# Patient Record
Sex: Male | Born: 1940 | Race: White | Hispanic: No | Marital: Married | Smoking: Never smoker
Health system: Southern US, Community
[De-identification: ages and names within clinical notes are randomized; demographics above are authoritative.]

## PROBLEM LIST (undated history)

## (undated) DIAGNOSIS — I10 Essential (primary) hypertension: Secondary | ICD-10-CM

## (undated) DIAGNOSIS — I700 Atherosclerosis of aorta: Secondary | ICD-10-CM

## (undated) DIAGNOSIS — N400 Enlarged prostate without lower urinary tract symptoms: Secondary | ICD-10-CM

## (undated) DIAGNOSIS — M109 Gout, unspecified: Secondary | ICD-10-CM

## (undated) DIAGNOSIS — E7800 Pure hypercholesterolemia, unspecified: Secondary | ICD-10-CM

## (undated) DIAGNOSIS — M1990 Unspecified osteoarthritis, unspecified site: Secondary | ICD-10-CM

## (undated) DIAGNOSIS — Z860100 Personal history of colon polyps, unspecified: Secondary | ICD-10-CM

## (undated) DIAGNOSIS — I129 Hypertensive chronic kidney disease with stage 1 through stage 4 chronic kidney disease, or unspecified chronic kidney disease: Secondary | ICD-10-CM

## (undated) DIAGNOSIS — N2889 Other specified disorders of kidney and ureter: Secondary | ICD-10-CM

## (undated) DIAGNOSIS — H9190 Unspecified hearing loss, unspecified ear: Secondary | ICD-10-CM

## (undated) DIAGNOSIS — R609 Edema, unspecified: Secondary | ICD-10-CM

## (undated) DIAGNOSIS — N281 Cyst of kidney, acquired: Secondary | ICD-10-CM

## (undated) DIAGNOSIS — C61 Malignant neoplasm of prostate: Secondary | ICD-10-CM

## (undated) DIAGNOSIS — E119 Type 2 diabetes mellitus without complications: Secondary | ICD-10-CM

## (undated) DIAGNOSIS — N1832 Chronic kidney disease, stage 3b: Secondary | ICD-10-CM

## (undated) DIAGNOSIS — K8020 Calculus of gallbladder without cholecystitis without obstruction: Secondary | ICD-10-CM

## (undated) HISTORY — DX: Personal history of colon polyps, unspecified: Z86.0100

## (undated) HISTORY — DX: Calculus of gallbladder without cholecystitis without obstruction: K80.20

## (undated) HISTORY — DX: Chronic kidney disease, stage 3b: N18.32

## (undated) HISTORY — DX: Edema, unspecified: R60.9

## (undated) HISTORY — DX: Other specified disorders of kidney and ureter: N28.89

## (undated) HISTORY — DX: Cyst of kidney, acquired: N28.1

## (undated) HISTORY — PX: UMBILICAL HERNIA REPAIR: SHX196

## (undated) HISTORY — DX: Hypertensive chronic kidney disease with stage 1 through stage 4 chronic kidney disease, or unspecified chronic kidney disease: I12.9

## (undated) HISTORY — DX: Gout, unspecified: M10.9

## (undated) HISTORY — DX: Unspecified hearing loss, unspecified ear: H91.90

## (undated) HISTORY — DX: Atherosclerosis of aorta: I70.0

## (undated) HISTORY — PX: TONSILLECTOMY: SUR1361

## (undated) HISTORY — DX: Pure hypercholesterolemia, unspecified: E78.00

## (undated) HISTORY — DX: Benign prostatic hyperplasia without lower urinary tract symptoms: N40.0

## (undated) HISTORY — PX: PROSTATE BIOPSY: SHX241

---

## 1988-06-15 HISTORY — PX: EYE SURGERY: SHX253

## 2016-06-30 DIAGNOSIS — Z0000 Encounter for general adult medical examination without abnormal findings: Secondary | ICD-10-CM | POA: Diagnosis not present

## 2016-06-30 DIAGNOSIS — I10 Essential (primary) hypertension: Secondary | ICD-10-CM | POA: Diagnosis not present

## 2016-06-30 DIAGNOSIS — E119 Type 2 diabetes mellitus without complications: Secondary | ICD-10-CM | POA: Diagnosis not present

## 2016-06-30 DIAGNOSIS — Z6824 Body mass index (BMI) 24.0-24.9, adult: Secondary | ICD-10-CM | POA: Diagnosis not present

## 2016-06-30 DIAGNOSIS — E782 Mixed hyperlipidemia: Secondary | ICD-10-CM | POA: Diagnosis not present

## 2016-06-30 DIAGNOSIS — N189 Chronic kidney disease, unspecified: Secondary | ICD-10-CM | POA: Diagnosis not present

## 2016-09-07 DIAGNOSIS — N401 Enlarged prostate with lower urinary tract symptoms: Secondary | ICD-10-CM | POA: Diagnosis not present

## 2016-09-07 DIAGNOSIS — R972 Elevated prostate specific antigen [PSA]: Secondary | ICD-10-CM | POA: Diagnosis not present

## 2016-09-07 DIAGNOSIS — R310 Gross hematuria: Secondary | ICD-10-CM | POA: Diagnosis not present

## 2016-12-28 DIAGNOSIS — E782 Mixed hyperlipidemia: Secondary | ICD-10-CM | POA: Diagnosis not present

## 2016-12-28 DIAGNOSIS — E119 Type 2 diabetes mellitus without complications: Secondary | ICD-10-CM | POA: Diagnosis not present

## 2016-12-28 DIAGNOSIS — I10 Essential (primary) hypertension: Secondary | ICD-10-CM | POA: Diagnosis not present

## 2016-12-28 DIAGNOSIS — Z6824 Body mass index (BMI) 24.0-24.9, adult: Secondary | ICD-10-CM | POA: Diagnosis not present

## 2017-03-10 DIAGNOSIS — R310 Gross hematuria: Secondary | ICD-10-CM | POA: Diagnosis not present

## 2017-03-10 DIAGNOSIS — R972 Elevated prostate specific antigen [PSA]: Secondary | ICD-10-CM | POA: Diagnosis not present

## 2017-03-10 DIAGNOSIS — N401 Enlarged prostate with lower urinary tract symptoms: Secondary | ICD-10-CM | POA: Diagnosis not present

## 2017-03-14 DIAGNOSIS — Z23 Encounter for immunization: Secondary | ICD-10-CM | POA: Diagnosis not present

## 2017-04-07 DIAGNOSIS — E119 Type 2 diabetes mellitus without complications: Secondary | ICD-10-CM | POA: Diagnosis not present

## 2017-04-07 DIAGNOSIS — Z961 Presence of intraocular lens: Secondary | ICD-10-CM | POA: Diagnosis not present

## 2017-04-07 DIAGNOSIS — H35362 Drusen (degenerative) of macula, left eye: Secondary | ICD-10-CM | POA: Diagnosis not present

## 2017-04-07 DIAGNOSIS — H0019 Chalazion unspecified eye, unspecified eyelid: Secondary | ICD-10-CM | POA: Diagnosis not present

## 2017-07-01 DIAGNOSIS — E559 Vitamin D deficiency, unspecified: Secondary | ICD-10-CM | POA: Diagnosis not present

## 2017-07-01 DIAGNOSIS — E782 Mixed hyperlipidemia: Secondary | ICD-10-CM | POA: Diagnosis not present

## 2017-07-01 DIAGNOSIS — Z6824 Body mass index (BMI) 24.0-24.9, adult: Secondary | ICD-10-CM | POA: Diagnosis not present

## 2017-07-01 DIAGNOSIS — I10 Essential (primary) hypertension: Secondary | ICD-10-CM | POA: Diagnosis not present

## 2017-07-01 DIAGNOSIS — E119 Type 2 diabetes mellitus without complications: Secondary | ICD-10-CM | POA: Diagnosis not present

## 2017-09-22 DIAGNOSIS — N401 Enlarged prostate with lower urinary tract symptoms: Secondary | ICD-10-CM | POA: Diagnosis not present

## 2017-09-22 DIAGNOSIS — R310 Gross hematuria: Secondary | ICD-10-CM | POA: Diagnosis not present

## 2017-09-22 DIAGNOSIS — R972 Elevated prostate specific antigen [PSA]: Secondary | ICD-10-CM | POA: Diagnosis not present

## 2017-10-28 DIAGNOSIS — L570 Actinic keratosis: Secondary | ICD-10-CM | POA: Diagnosis not present

## 2017-10-28 DIAGNOSIS — L918 Other hypertrophic disorders of the skin: Secondary | ICD-10-CM | POA: Diagnosis not present

## 2017-11-30 DIAGNOSIS — M25552 Pain in left hip: Secondary | ICD-10-CM | POA: Diagnosis not present

## 2017-11-30 DIAGNOSIS — Z79899 Other long term (current) drug therapy: Secondary | ICD-10-CM | POA: Diagnosis not present

## 2017-11-30 DIAGNOSIS — D126 Benign neoplasm of colon, unspecified: Secondary | ICD-10-CM | POA: Diagnosis not present

## 2017-11-30 DIAGNOSIS — N183 Chronic kidney disease, stage 3 (moderate): Secondary | ICD-10-CM | POA: Diagnosis not present

## 2017-11-30 DIAGNOSIS — E7800 Pure hypercholesterolemia, unspecified: Secondary | ICD-10-CM | POA: Diagnosis not present

## 2017-11-30 DIAGNOSIS — E1122 Type 2 diabetes mellitus with diabetic chronic kidney disease: Secondary | ICD-10-CM | POA: Diagnosis not present

## 2017-11-30 DIAGNOSIS — M25551 Pain in right hip: Secondary | ICD-10-CM | POA: Diagnosis not present

## 2018-02-23 DIAGNOSIS — Z86010 Personal history of colonic polyps: Secondary | ICD-10-CM | POA: Diagnosis not present

## 2018-02-23 DIAGNOSIS — D122 Benign neoplasm of ascending colon: Secondary | ICD-10-CM | POA: Diagnosis not present

## 2018-02-23 DIAGNOSIS — K5730 Diverticulosis of large intestine without perforation or abscess without bleeding: Secondary | ICD-10-CM | POA: Diagnosis not present

## 2018-02-23 DIAGNOSIS — D120 Benign neoplasm of cecum: Secondary | ICD-10-CM | POA: Diagnosis not present

## 2018-02-25 DIAGNOSIS — D122 Benign neoplasm of ascending colon: Secondary | ICD-10-CM | POA: Diagnosis not present

## 2018-02-25 DIAGNOSIS — D120 Benign neoplasm of cecum: Secondary | ICD-10-CM | POA: Diagnosis not present

## 2018-04-10 DIAGNOSIS — Z23 Encounter for immunization: Secondary | ICD-10-CM | POA: Diagnosis not present

## 2018-05-09 DIAGNOSIS — E1122 Type 2 diabetes mellitus with diabetic chronic kidney disease: Secondary | ICD-10-CM | POA: Diagnosis not present

## 2018-05-09 DIAGNOSIS — Z79899 Other long term (current) drug therapy: Secondary | ICD-10-CM | POA: Diagnosis not present

## 2018-05-09 DIAGNOSIS — Z0000 Encounter for general adult medical examination without abnormal findings: Secondary | ICD-10-CM | POA: Diagnosis not present

## 2018-05-09 DIAGNOSIS — E119 Type 2 diabetes mellitus without complications: Secondary | ICD-10-CM | POA: Diagnosis not present

## 2018-05-09 DIAGNOSIS — N183 Chronic kidney disease, stage 3 (moderate): Secondary | ICD-10-CM | POA: Diagnosis not present

## 2018-05-09 DIAGNOSIS — Z1389 Encounter for screening for other disorder: Secondary | ICD-10-CM | POA: Diagnosis not present

## 2018-05-09 DIAGNOSIS — Z135 Encounter for screening for eye and ear disorders: Secondary | ICD-10-CM | POA: Diagnosis not present

## 2018-05-09 DIAGNOSIS — Z23 Encounter for immunization: Secondary | ICD-10-CM | POA: Diagnosis not present

## 2018-05-09 DIAGNOSIS — E7800 Pure hypercholesterolemia, unspecified: Secondary | ICD-10-CM | POA: Diagnosis not present

## 2018-06-21 DIAGNOSIS — H524 Presbyopia: Secondary | ICD-10-CM | POA: Diagnosis not present

## 2018-06-21 DIAGNOSIS — E119 Type 2 diabetes mellitus without complications: Secondary | ICD-10-CM | POA: Diagnosis not present

## 2018-06-21 DIAGNOSIS — H353121 Nonexudative age-related macular degeneration, left eye, early dry stage: Secondary | ICD-10-CM | POA: Diagnosis not present

## 2018-07-01 DIAGNOSIS — E1122 Type 2 diabetes mellitus with diabetic chronic kidney disease: Secondary | ICD-10-CM | POA: Diagnosis not present

## 2018-07-01 DIAGNOSIS — M5442 Lumbago with sciatica, left side: Secondary | ICD-10-CM | POA: Diagnosis not present

## 2018-07-01 DIAGNOSIS — N183 Chronic kidney disease, stage 3 (moderate): Secondary | ICD-10-CM | POA: Diagnosis not present

## 2018-07-01 DIAGNOSIS — I129 Hypertensive chronic kidney disease with stage 1 through stage 4 chronic kidney disease, or unspecified chronic kidney disease: Secondary | ICD-10-CM | POA: Diagnosis not present

## 2018-11-09 DIAGNOSIS — N183 Chronic kidney disease, stage 3 (moderate): Secondary | ICD-10-CM | POA: Diagnosis not present

## 2018-11-09 DIAGNOSIS — R972 Elevated prostate specific antigen [PSA]: Secondary | ICD-10-CM | POA: Diagnosis not present

## 2018-11-09 DIAGNOSIS — I129 Hypertensive chronic kidney disease with stage 1 through stage 4 chronic kidney disease, or unspecified chronic kidney disease: Secondary | ICD-10-CM | POA: Diagnosis not present

## 2018-11-09 DIAGNOSIS — E1122 Type 2 diabetes mellitus with diabetic chronic kidney disease: Secondary | ICD-10-CM | POA: Diagnosis not present

## 2018-11-09 DIAGNOSIS — Z7984 Long term (current) use of oral hypoglycemic drugs: Secondary | ICD-10-CM | POA: Diagnosis not present

## 2018-12-14 DIAGNOSIS — R972 Elevated prostate specific antigen [PSA]: Secondary | ICD-10-CM | POA: Diagnosis not present

## 2018-12-14 DIAGNOSIS — N401 Enlarged prostate with lower urinary tract symptoms: Secondary | ICD-10-CM | POA: Diagnosis not present

## 2018-12-14 DIAGNOSIS — R351 Nocturia: Secondary | ICD-10-CM | POA: Diagnosis not present

## 2019-03-25 DIAGNOSIS — Z23 Encounter for immunization: Secondary | ICD-10-CM | POA: Diagnosis not present

## 2019-04-28 DIAGNOSIS — R972 Elevated prostate specific antigen [PSA]: Secondary | ICD-10-CM | POA: Diagnosis not present

## 2019-05-08 ENCOUNTER — Other Ambulatory Visit (HOSPITAL_COMMUNITY): Payer: Self-pay | Admitting: Urology

## 2019-05-08 ENCOUNTER — Other Ambulatory Visit: Payer: Self-pay | Admitting: Urology

## 2019-05-08 DIAGNOSIS — C61 Malignant neoplasm of prostate: Secondary | ICD-10-CM

## 2019-05-31 ENCOUNTER — Encounter (HOSPITAL_COMMUNITY)
Admission: RE | Admit: 2019-05-31 | Discharge: 2019-05-31 | Disposition: A | Discharge status: Home / Self Care | Payer: Medicare Other | Source: Ambulatory Visit | Attending: Urology | Admitting: Urology

## 2019-05-31 ENCOUNTER — Ambulatory Visit (HOSPITAL_COMMUNITY)
Admission: RE | Admit: 2019-05-31 | Discharge: 2019-05-31 | Disposition: A | Discharge status: Home / Self Care | Payer: Medicare Other | Source: Ambulatory Visit | Attending: Urology | Admitting: Urology

## 2019-05-31 ENCOUNTER — Other Ambulatory Visit: Payer: Self-pay

## 2019-05-31 DIAGNOSIS — C61 Malignant neoplasm of prostate: Secondary | ICD-10-CM | POA: Diagnosis present

## 2019-05-31 MED ORDER — TECHNETIUM TC 99M MEDRONATE IV KIT
20.9000 | PACK | Freq: Once | INTRAVENOUS | Status: AC
  Administered 2019-05-31: 20.9 via INTRAVENOUS

## 2019-06-23 DIAGNOSIS — H52203 Unspecified astigmatism, bilateral: Secondary | ICD-10-CM | POA: Diagnosis not present

## 2019-06-23 DIAGNOSIS — E119 Type 2 diabetes mellitus without complications: Secondary | ICD-10-CM | POA: Diagnosis not present

## 2019-06-26 ENCOUNTER — Telehealth: Payer: Self-pay | Admitting: Medical Oncology

## 2019-06-26 ENCOUNTER — Encounter: Payer: Self-pay | Admitting: Radiation Oncology

## 2019-06-26 NOTE — Telephone Encounter (Signed)
Left message for Dr. Diona Fanti per Ailene Ards, [place] asking if R renal mass needs to be addressed  before proceeding with prostate cancer treatment? I asked for a return call to discuss.

## 2019-06-26 NOTE — Progress Notes (Signed)
GU Location of Tumor / Histology: prostatic adenocarcinoma  If Prostate Cancer, Gleason Score is (4 + 3) and PSA is (4.61). Prostate volume: 49.82   Rosebud Poles with a history of BPH and hematuria which resolve on Finasteride. Initially, PSA dropped on Finasteride but started rising thus [place] referred to [place] for prostate biopsy.  Biopsies of prostate (if applicable) revealed:   Past/Anticipated interventions by urology, if any: prostate biopsy, CT scan (negative for mets), bone scan (negative), referral to Dr. Tammi Klippel for consideration of radiotherapy  Past/Anticipated interventions by medical oncology, if any: no  Weight changes, if any: no  Bowel/Bladder complaints, if any: IPSS 12. SHIM 24. ("Tell Dr. Tammi Klippel not to do anything to me that would change my sexual function.")Reports weak stream, intermittency, hesitancy, and leakage. Reports nocturia x 1-2.  Taking Flomax. Denies any bowel complaints.  Nausea/Vomiting, if any: no  Pain issues, if any:  Denies   SAFETY ISSUES:  Prior radiation? denies  Pacemaker/ICD? denies  Possible current pregnancy? no, male patient  Is the patient on methotrexate? No  Current Complaints / other details:  79 year old male. Married. Worked in [employer]. Resides in [place], [place]. Father with history of prostate and bladder ca.

## 2019-06-27 ENCOUNTER — Other Ambulatory Visit: Payer: Self-pay

## 2019-06-27 ENCOUNTER — Encounter: Payer: Self-pay | Admitting: Radiation Oncology

## 2019-06-27 ENCOUNTER — Ambulatory Visit
Admission: RE | Admit: 2019-06-27 | Discharge: 2019-06-27 | Disposition: A | Discharge status: Home / Self Care | Payer: Medicare Other | Source: Ambulatory Visit | Attending: Radiation Oncology | Admitting: Radiation Oncology

## 2019-06-27 VITALS — Ht 72.0 in | Wt 171.2 lb

## 2019-06-27 DIAGNOSIS — N400 Enlarged prostate without lower urinary tract symptoms: Secondary | ICD-10-CM | POA: Diagnosis not present

## 2019-06-27 DIAGNOSIS — C61 Malignant neoplasm of prostate: Secondary | ICD-10-CM | POA: Diagnosis not present

## 2019-06-27 DIAGNOSIS — Z79899 Other long term (current) drug therapy: Secondary | ICD-10-CM | POA: Diagnosis not present

## 2019-06-27 DIAGNOSIS — N2889 Other specified disorders of kidney and ureter: Secondary | ICD-10-CM | POA: Diagnosis not present

## 2019-06-27 DIAGNOSIS — Z8042 Family history of malignant neoplasm of prostate: Secondary | ICD-10-CM | POA: Diagnosis not present

## 2019-06-27 DIAGNOSIS — R972 Elevated prostate specific antigen [PSA]: Secondary | ICD-10-CM | POA: Diagnosis not present

## 2019-06-27 HISTORY — DX: Type 2 diabetes mellitus without complications: E11.9

## 2019-06-27 HISTORY — DX: Essential (primary) hypertension: I10

## 2019-06-27 HISTORY — DX: Malignant neoplasm of prostate: C61

## 2019-06-27 NOTE — Progress Notes (Signed)
Radiation Oncology         [phone] ________________________________  Initial outpatient Consultation - Conducted via Telephone due to current COVID-19 concerns for limiting Terry [name] exposure  Name: Terry [name] MRN: [id number]  Date: 06/27/2019  DOB: 11-29-1940  CC:No primary care provider on file.  Terry [name], [name]   REFERRING PHYSICIAN: Franchot [name], [name]  DIAGNOSIS: 79 y.o. gentleman with Stage T1c adenocarcinoma of the prostate with Gleason score of 4+3, and PSA of 4.61 on finasteride.    ICD-10-CM   1. Malignant neoplasm of prostate ([name]     HISTORY OF PRESENT ILLNESS: Terry [name] is a 79 y.o. male with a diagnosis of prostate cancer. He has a longstanding history of BPH with BSO previously followed with Terry urologist while living in [place] and treated with finasteride.  He and Terry [name] relocated to [place] around 2018 and he has continued taking finasteride daily under the care and direction of Terry PCP, Terry [name]. He was noted to have an elevated PSA of 4.78 in May 2020, which was increased from 3.34 in September 2018 and 3.26 in March 2018 despite taking finasteride.  Accordingly, he was referred for evaluation in urology by Dr. Diona [name] on 12/14/2018,  digital rectal examination was performed at that time revealing a larger right lobe without nodularity or other concerning features.  He was started on Flomax at that time due to some increasing LUTS.  A repeat PSA performed on 03/16/2019 remained elevated at 4.61.  Therefore, the Terry [name] proceeded to transrectal ultrasound with 12 biopsies of the prostate on 04/28/2019.  The prostate volume measured 49.82 cc.  Out of 12 core biopsies, 1 was positive.  The maximum Gleason score was 4+3, and this was seen in the left apex lateral.  He underwent staging scans on 05/31/2019 with a CT A/P which revealed a 4 cm exophytic cystic mass in the medial interpolar right kidney with ill-defined/irregular peripheral  enhancement and a 13 mm enhancing nodular component, suspicious for cystic renal cell carcinoma.  There was no evidence for metastatic disease, specifically no lymphadenopathy or suspicious bony lesions. Bone scan that same day was also negative for skeletal metastasis.  The Terry [name] reviewed the biopsy results with Terry urologist and he has kindly been referred today for discussion of potential radiation treatment options.   PREVIOUS RADIATION THERAPY: No  PAST MEDICAL HISTORY:  Past Medical History:  Diagnosis Date   Diabetes mellitus without complication ([facility])    Hypertension    Prostate cancer ([facility])       PAST SURGICAL HISTORY: Past Surgical History:  Procedure Laterality Date   PROSTATE BIOPSY      FAMILY HISTORY:  Family History  Problem Relation Age of Onset   Prostate cancer Father    Bladder Cancer Father    Breast cancer Neg Hx    Colon cancer Neg Hx    Pancreatic cancer Neg Hx     SOCIAL HISTORY:  Social History   Socioeconomic History   Marital status: Married    Spouse name: Terry [name]   Number of children: Not on file   Years of education: Not on file   Highest education level: Not on file  Occupational History    Comment: retired  Tobacco Use   Smoking status: Never Smoker   Smokeless tobacco: Never Used  Substance and Sexual Activity   Alcohol use: Never   Drug use: Never   Sexual activity: Yes  Other Topics Concern   Not on  file  Social History Narrative   2 stepchildren and 2 daughters.   Social Determinants of Health   Financial Resource Strain:    Difficulty of Paying Living Expenses: Not on file  Food Insecurity:    Worried About Charity fundraiser in the Last Year: Not on file   [employer] of Food in the Last Year: Not on file  Transportation Needs:    Lack of Transportation (Medical): Not on file   Lack of Transportation (Non-Medical): Not on file  Physical Activity:    Days of Exercise per Week: Not on  file   Minutes of Exercise per Session: Not on file  Stress:    Feeling of Stress : Not on file  Social Connections:    Frequency of Communication with Friends and Family: Not on file   Frequency of Social Gatherings with Friends and Family: Not on file   Attends Religious Services: Not on file   Active Member of Clubs or Organizations: Not on file   Attends Archivist Meetings: Not on file   Marital Status: Not on file  Intimate Partner Violence:    Fear of Current or Ex-Partner: Not on file   Emotionally Abused: Not on file   Physically Abused: Not on file   Sexually Abused: Not on file    ALLERGIES: Terry [name] has no known allergies.  MEDICATIONS:  Current Outpatient Medications  Medication Sig Dispense Refill   atorvastatin (LIPITOR) 10 MG tablet Take 10 mg by mouth daily.     CONTOUR NEXT TEST test strip CHECK BLOOD SUGARS ONCE IN THE MORNING IN VITRO **E11.22**     finasteride (PROSCAR) 5 MG tablet Take 5 mg by mouth daily.     lisinopril (ZESTRIL) 20 MG tablet Take 20 mg by mouth daily.     metFORMIN (GLUCOPHAGE) 500 MG tablet Take 500 mg by mouth daily with breakfast.      tamsulosin (FLOMAX) 0.4 MG CAPS capsule Take 0.4 mg by mouth daily.     No current facility-administered medications for this encounter.    REVIEW OF SYSTEMS:  On review of systems, the Terry [name] reports that he is doing well overall. He denies any chest pain, shortness of breath, cough, fevers, chills, night sweats, unintended weight changes. He denies any bowel disturbances, and denies abdominal pain, nausea or vomiting. He denies any new musculoskeletal or joint aches or pains. Terry IPSS was 12, indicating moderate urinary symptoms despite taking Finasteride and Flomax daily. He reports nocturia x1-2, weak stream, intermittency, hesitancy, and leakage. He endorses taking Flomax, which has helped Terry symptoms. Terry SHIM was 24, indicating he does not have erectile dysfunction. He  notes Terry sexual function is very important to him. A complete review of systems is obtained and is otherwise negative.    PHYSICAL EXAM:  Wt Readings from Last 3 Encounters:  06/27/19 171 lb 3.2 oz (77.7 kg)  05/31/19 170 lb (77.1 kg)   Temp Readings from Last 3 Encounters:  No data found for Temp   BP Readings from Last 3 Encounters:  No data found for BP   Pulse Readings from Last 3 Encounters:  No data found for Pulse   Pain Assessment Pain Score: 0-No pain/10  Physical exam not performed in light of telephone consult visit format.   KPS = 90  100 - Normal; no complaints; no evidence of disease. 90   - Able to carry on normal activity; minor signs or symptoms of disease. 80   -  Normal activity with effort; some signs or symptoms of disease. 61   - Cares for self; unable to carry on normal activity or to do active work. 60   - Requires occasional assistance, but is able to care for most of Terry personal needs. 50   - Requires considerable assistance and frequent medical care. 29   - Disabled; requires special care and assistance. 33   - Severely disabled; hospital admission is indicated although death not imminent. 91   - Very sick; hospital admission necessary; active supportive treatment necessary. 10   - Moribund; fatal processes progressing rapidly. 0     - Dead  Karnofsky DA, Abelmann WH, Craver LS and Burchenal JH [phone]) The use of the nitrogen mustards in the palliative treatment of carcinoma: with particular reference to bronchogenic carcinoma Cancer 1 634-56  LABORATORY DATA:  No results found for: WBC, HGB, HCT, MCV, PLT No results found for: NA, K, CL, CO2 No results found for: ALT, AST, GGT, ALKPHOS, BILITOT   RADIOGRAPHY: No results found.    IMPRESSION/PLAN: This visit was conducted via Telephone to spare the Terry [name] unnecessary potential exposure in the healthcare setting during the current COVID-19 pandemic. 1. 79 y.o. gentleman with Stage T1c  adenocarcinoma of the prostate with Gleason Score of 4+3, and PSA of 4.61 on finasteride. We discussed the Terry [name]'s workup and outlined the nature of prostate cancer in this setting. The Terry [name]'s T stage, Gleason's score, and PSA put him into the unfavorable intermediate risk group. Accordingly, he is eligible for a variety of potential treatment options including brachytherapy or 5.5 weeks of external beam radiation. He is not an ideal candidate for brachytherapy due to Terry IPSS score of 12 despite maximal medical therapy with both finasteride and flomax. We discussed the available radiation techniques, and focused on the details and logistics of delivery. We discussed and outlined the risks, benefits, short and long-term effects associated with radiotherapy and compared and contrasted these with prostatectomy. We also discussed the role of SpaceOAR in reducing the rectal toxicity associated with radiotherapy. He was encouraged to ask questions that were answered to Terry stated satisfaction.  At the end of the conversation the Terry [name] is undecided regarding Terry final treatment preference but is leaning towards moving forward with 5.5 weeks of external beam therapy. He continues working full time from home in [employer] so he is still considering brachytherapy, but is aware that this would almost certainly cause more BOO symptoms for him in the first 2-3 months following seed implant. He is planing to reach a decision in the next week and will call us at that time so that we can move forward with treatment planning accordingly. Should he elect to proceed with prostate IMRT, we will share our discussion with Dr. Diona [name] and move forward with coordinating for fiducial marker and SpaceOAR gel placment prior to simulation, to reduce rectal toxicity from radiotherapy. He appears to have a good understanding of Terry disease and our treatment recommendations which are of curative intent.  We will follow up to confirm  Terry treatment preference and move forward with treatment planning at that time.  2. Right renal mass.  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 Diona [name].  Given current concerns for Terry [name] exposure during the COVID-19 pandemic, this encounter was conducted  via telephone. The Terry [name] was notified in advance and was offered a MyChart meeting to allow for face to face communication but unfortunately reported that he did not have the appropriate resources/technology to support such a visit and instead preferred to proceed with telephone consult. The Terry [name] has given verbal consent for this type of encounter. The time spent during this encounter was 60 minutes. The attendants for this meeting include Terry [name], Terry [name], Terry [name], Terry [name], Terry [name] and Terry [name]. During the encounter, Terry [name], Terry [name], and scribe, Wilburn Mylar were located at [place].  Terry [name], Terry [name] and Terry [name] were located at home.    Terry Johns, [name]    Terry Pita, [name]  [facility] Direct Dial: [phone]   Fax: [phone] [email or web address]   Skype   LinkedIn  This document serves as a record of services personally performed by Terry Pita, [name] and Freeman Caldron, [name]. It was created on their behalf by Wilburn Mylar, a trained medical scribe. The creation of this record is based on the scribe's personal observations and the provider's statements to them. This document has been checked and approved by the attending provider.

## 2019-06-27 NOTE — Progress Notes (Signed)
See progress note under physician encounter. 

## 2019-07-03 ENCOUNTER — Telehealth: Payer: Self-pay | Admitting: Medical Oncology

## 2019-07-03 NOTE — Telephone Encounter (Signed)
Spoke with Mr. Lortz to introduce myself as the prostate nurse navigator and discuss my role. I was unable to meet him 1/12 when he consulted with Dr. Tammi Klippel. He was undecided after the consult but has decided to move forward with 5 1/2 weeks of radiation. I informed him, Enid Derry will schedule appointment with Dr. Diona Fanti for placement of gold markers and SpaceOar gel. I discuss CT simulation and all questions were answered. I gave him my contact information and asked him to call me with questions or concerns. He voiced understanding.

## 2019-07-10 ENCOUNTER — Telehealth: Payer: Self-pay | Admitting: *Deleted

## 2019-07-10 NOTE — Telephone Encounter (Signed)
Called patient to inform of fid. markers and space oar on Feb. 2 @ [facility] and his sim will be on Friday Feb. 5 - arrival time- 8:45 am @ Dr. Johny Shears Office, spoke with patient and he is aware of these appts.

## 2019-07-13 ENCOUNTER — Other Ambulatory Visit: Payer: Self-pay | Admitting: Urology

## 2019-07-13 DIAGNOSIS — C61 Malignant neoplasm of prostate: Secondary | ICD-10-CM

## 2019-07-18 ENCOUNTER — Telehealth: Payer: Self-pay | Admitting: *Deleted

## 2019-07-18 DIAGNOSIS — C61 Malignant neoplasm of prostate: Secondary | ICD-10-CM | POA: Diagnosis not present

## 2019-07-18 NOTE — Telephone Encounter (Signed)
CALLED PATIENT TO INFORM OF MRI FOR 07-20-19 - ARRIVAL TIME- 7:30 AM, AND HIS SIM APPT. ON 07-21-19 @ [facility], LVM FOR A RETURN CALL

## 2019-07-18 NOTE — Telephone Encounter (Signed)
CORRECTION - MRI - ARRIVAL TIME- 7:30 PM @ [facility]

## 2019-07-20 ENCOUNTER — Ambulatory Visit (HOSPITAL_COMMUNITY)
Admission: RE | Admit: 2019-07-20 | Discharge: 2019-07-20 | Disposition: A | Discharge status: Home / Self Care | Payer: Medicare Other | Source: Ambulatory Visit | Attending: Urology | Admitting: Urology

## 2019-07-20 ENCOUNTER — Other Ambulatory Visit: Payer: Self-pay

## 2019-07-20 ENCOUNTER — Telehealth: Payer: Self-pay | Admitting: *Deleted

## 2019-07-20 DIAGNOSIS — C61 Malignant neoplasm of prostate: Secondary | ICD-10-CM | POA: Insufficient documentation

## 2019-07-20 NOTE — Telephone Encounter (Signed)
CALLED PATIENT TO REMIND OF MRI FOR 07-20-19 AND HIS SIM FOR 07-21-19, SPOKE WITH PATIENT AND HE IS AWARE OF THESE APPTS.

## 2019-07-21 ENCOUNTER — Ambulatory Visit
Admission: RE | Admit: 2019-07-21 | Discharge: 2019-07-21 | Disposition: A | Discharge status: Home / Self Care | Payer: Medicare Other | Source: Ambulatory Visit | Attending: Radiation Oncology | Admitting: Radiation Oncology

## 2019-07-21 ENCOUNTER — Other Ambulatory Visit: Payer: Self-pay

## 2019-07-21 DIAGNOSIS — Z510 Encounter for antineoplastic radiation therapy: Secondary | ICD-10-CM | POA: Diagnosis not present

## 2019-07-21 DIAGNOSIS — C61 Malignant neoplasm of prostate: Secondary | ICD-10-CM | POA: Diagnosis not present

## 2019-07-21 NOTE — Progress Notes (Signed)
  Radiation Oncology         [phone]) [phone] ________________________________  Name: Terry [name] MRN: [id number]  Date: 07/21/2019  DOB: 06/03/1941  SIMULATION AND TREATMENT PLANNING NOTE    ICD-10-CM   1. Malignant neoplasm of prostate ([facility])  C61     DIAGNOSIS:  79 y.o. gentleman with Stage T1c adenocarcinoma of the prostate with Gleason score of 4+3, and PSA of 4.61 on finasteride  NARRATIVE:  The patient was brought to the [facility].  Identity was confirmed.  All relevant records and images related to the planned course of therapy were reviewed.  The patient freely provided informed written consent to proceed with treatment after reviewing the details related to the planned course of therapy. The consent form was witnessed and verified by the simulation staff.  Then, the patient was set-up in a stable reproducible supine position for radiation therapy.  A vacuum lock pillow device was custom fabricated to position his legs in a reproducible immobilized position.  Then, I performed a urethrogram under sterile conditions to identify the prostatic apex.  CT images were obtained.  Surface markings were placed.  The CT images were loaded into the planning software.  Then the prostate target and avoidance structures including the rectum, bladder, bowel and hips were contoured.  Treatment planning then occurred.  The radiation prescription was entered and confirmed.  A total of one complex treatment devices was fabricated. I have requested : Intensity Modulated Radiotherapy (IMRT) is medically necessary for this case for the following reason:  Rectal sparing.Marland Kitchen  PLAN:  The patient will receive 70 Gy in 28 fractions.  ________________________________  Sheral Apley Tammi Klippel, M.D.

## 2019-07-25 DIAGNOSIS — Z510 Encounter for antineoplastic radiation therapy: Secondary | ICD-10-CM | POA: Diagnosis not present

## 2019-07-25 DIAGNOSIS — C61 Malignant neoplasm of prostate: Secondary | ICD-10-CM | POA: Diagnosis not present

## 2019-08-01 ENCOUNTER — Other Ambulatory Visit: Payer: Self-pay

## 2019-08-01 ENCOUNTER — Ambulatory Visit
Admission: RE | Admit: 2019-08-01 | Discharge: 2019-08-01 | Disposition: A | Discharge status: Home / Self Care | Payer: Medicare Other | Source: Ambulatory Visit | Attending: Radiation Oncology | Admitting: Radiation Oncology

## 2019-08-01 ENCOUNTER — Encounter: Payer: Self-pay | Admitting: Medical Oncology

## 2019-08-01 DIAGNOSIS — Z510 Encounter for antineoplastic radiation therapy: Secondary | ICD-10-CM | POA: Diagnosis not present

## 2019-08-01 DIAGNOSIS — C61 Malignant neoplasm of prostate: Secondary | ICD-10-CM | POA: Diagnosis not present

## 2019-08-02 ENCOUNTER — Ambulatory Visit
Admission: RE | Admit: 2019-08-02 | Discharge: 2019-08-02 | Disposition: A | Discharge status: Home / Self Care | Payer: Medicare Other | Source: Ambulatory Visit | Attending: Radiation Oncology | Admitting: Radiation Oncology

## 2019-08-02 DIAGNOSIS — Z510 Encounter for antineoplastic radiation therapy: Secondary | ICD-10-CM | POA: Diagnosis not present

## 2019-08-02 DIAGNOSIS — C61 Malignant neoplasm of prostate: Secondary | ICD-10-CM | POA: Diagnosis not present

## 2019-08-03 ENCOUNTER — Ambulatory Visit
Admission: RE | Admit: 2019-08-03 | Discharge: 2019-08-03 | Disposition: A | Discharge status: Home / Self Care | Payer: Medicare Other | Source: Ambulatory Visit | Attending: Radiation Oncology | Admitting: Radiation Oncology

## 2019-08-03 ENCOUNTER — Other Ambulatory Visit: Payer: Self-pay

## 2019-08-03 DIAGNOSIS — C61 Malignant neoplasm of prostate: Secondary | ICD-10-CM | POA: Diagnosis not present

## 2019-08-03 DIAGNOSIS — Z510 Encounter for antineoplastic radiation therapy: Secondary | ICD-10-CM | POA: Diagnosis not present

## 2019-08-04 ENCOUNTER — Ambulatory Visit
Admission: RE | Admit: 2019-08-04 | Discharge: 2019-08-04 | Disposition: A | Discharge status: Home / Self Care | Payer: Medicare Other | Source: Ambulatory Visit | Attending: Radiation Oncology | Admitting: Radiation Oncology

## 2019-08-04 ENCOUNTER — Other Ambulatory Visit: Payer: Self-pay

## 2019-08-04 DIAGNOSIS — Z510 Encounter for antineoplastic radiation therapy: Secondary | ICD-10-CM | POA: Diagnosis not present

## 2019-08-04 DIAGNOSIS — C61 Malignant neoplasm of prostate: Secondary | ICD-10-CM | POA: Diagnosis not present

## 2019-08-07 ENCOUNTER — Other Ambulatory Visit: Payer: Self-pay

## 2019-08-07 ENCOUNTER — Ambulatory Visit
Admission: RE | Admit: 2019-08-07 | Discharge: 2019-08-07 | Disposition: A | Discharge status: Home / Self Care | Payer: Medicare Other | Source: Ambulatory Visit | Attending: Radiation Oncology | Admitting: Radiation Oncology

## 2019-08-07 DIAGNOSIS — C61 Malignant neoplasm of prostate: Secondary | ICD-10-CM | POA: Diagnosis not present

## 2019-08-07 DIAGNOSIS — Z510 Encounter for antineoplastic radiation therapy: Secondary | ICD-10-CM | POA: Diagnosis not present

## 2019-08-08 ENCOUNTER — Other Ambulatory Visit: Payer: Self-pay

## 2019-08-08 ENCOUNTER — Ambulatory Visit
Admission: RE | Admit: 2019-08-08 | Discharge: 2019-08-08 | Disposition: A | Discharge status: Home / Self Care | Payer: Medicare Other | Source: Ambulatory Visit | Attending: Radiation Oncology | Admitting: Radiation Oncology

## 2019-08-08 DIAGNOSIS — Z510 Encounter for antineoplastic radiation therapy: Secondary | ICD-10-CM | POA: Diagnosis not present

## 2019-08-08 DIAGNOSIS — C61 Malignant neoplasm of prostate: Secondary | ICD-10-CM | POA: Diagnosis not present

## 2019-08-09 ENCOUNTER — Other Ambulatory Visit: Payer: Self-pay

## 2019-08-09 ENCOUNTER — Ambulatory Visit
Admission: RE | Admit: 2019-08-09 | Discharge: 2019-08-09 | Disposition: A | Discharge status: Home / Self Care | Payer: Medicare Other | Source: Ambulatory Visit | Attending: Radiation Oncology | Admitting: Radiation Oncology

## 2019-08-09 DIAGNOSIS — Z510 Encounter for antineoplastic radiation therapy: Secondary | ICD-10-CM | POA: Diagnosis not present

## 2019-08-09 DIAGNOSIS — C61 Malignant neoplasm of prostate: Secondary | ICD-10-CM | POA: Diagnosis not present

## 2019-08-10 ENCOUNTER — Other Ambulatory Visit: Payer: Self-pay

## 2019-08-10 ENCOUNTER — Ambulatory Visit
Admission: RE | Admit: 2019-08-10 | Discharge: 2019-08-10 | Disposition: A | Discharge status: Home / Self Care | Payer: Medicare Other | Source: Ambulatory Visit | Attending: Radiation Oncology | Admitting: Radiation Oncology

## 2019-08-10 DIAGNOSIS — Z510 Encounter for antineoplastic radiation therapy: Secondary | ICD-10-CM | POA: Diagnosis not present

## 2019-08-10 DIAGNOSIS — D485 Neoplasm of uncertain behavior of skin: Secondary | ICD-10-CM | POA: Diagnosis not present

## 2019-08-10 DIAGNOSIS — C61 Malignant neoplasm of prostate: Secondary | ICD-10-CM | POA: Diagnosis not present

## 2019-08-10 DIAGNOSIS — L820 Inflamed seborrheic keratosis: Secondary | ICD-10-CM | POA: Diagnosis not present

## 2019-08-10 DIAGNOSIS — L570 Actinic keratosis: Secondary | ICD-10-CM | POA: Diagnosis not present

## 2019-08-10 DIAGNOSIS — Z23 Encounter for immunization: Secondary | ICD-10-CM | POA: Diagnosis not present

## 2019-08-10 DIAGNOSIS — L821 Other seborrheic keratosis: Secondary | ICD-10-CM | POA: Diagnosis not present

## 2019-08-11 ENCOUNTER — Ambulatory Visit
Admission: RE | Admit: 2019-08-11 | Discharge: 2019-08-11 | Disposition: A | Discharge status: Home / Self Care | Payer: Medicare Other | Source: Ambulatory Visit | Attending: Radiation Oncology | Admitting: Radiation Oncology

## 2019-08-11 ENCOUNTER — Other Ambulatory Visit: Payer: Self-pay

## 2019-08-11 DIAGNOSIS — C61 Malignant neoplasm of prostate: Secondary | ICD-10-CM | POA: Diagnosis not present

## 2019-08-11 DIAGNOSIS — Z510 Encounter for antineoplastic radiation therapy: Secondary | ICD-10-CM | POA: Diagnosis not present

## 2019-08-14 ENCOUNTER — Ambulatory Visit
Admission: RE | Admit: 2019-08-14 | Discharge: 2019-08-14 | Disposition: A | Discharge status: Home / Self Care | Payer: Medicare Other | Source: Ambulatory Visit | Attending: Radiation Oncology | Admitting: Radiation Oncology

## 2019-08-14 ENCOUNTER — Other Ambulatory Visit: Payer: Self-pay

## 2019-08-14 DIAGNOSIS — C61 Malignant neoplasm of prostate: Secondary | ICD-10-CM | POA: Insufficient documentation

## 2019-08-14 DIAGNOSIS — Z510 Encounter for antineoplastic radiation therapy: Secondary | ICD-10-CM | POA: Diagnosis not present

## 2019-08-15 ENCOUNTER — Other Ambulatory Visit: Payer: Self-pay

## 2019-08-15 ENCOUNTER — Encounter: Payer: Self-pay | Admitting: Medical Oncology

## 2019-08-15 ENCOUNTER — Ambulatory Visit
Admission: RE | Admit: 2019-08-15 | Discharge: 2019-08-15 | Disposition: A | Discharge status: Home / Self Care | Payer: Medicare Other | Source: Ambulatory Visit | Attending: Radiation Oncology | Admitting: Radiation Oncology

## 2019-08-15 DIAGNOSIS — Z510 Encounter for antineoplastic radiation therapy: Secondary | ICD-10-CM | POA: Diagnosis not present

## 2019-08-15 DIAGNOSIS — C61 Malignant neoplasm of prostate: Secondary | ICD-10-CM | POA: Diagnosis not present

## 2019-08-16 ENCOUNTER — Ambulatory Visit
Admission: RE | Admit: 2019-08-16 | Discharge: 2019-08-16 | Disposition: A | Discharge status: Home / Self Care | Payer: Medicare Other | Source: Ambulatory Visit | Attending: Radiation Oncology | Admitting: Radiation Oncology

## 2019-08-16 ENCOUNTER — Other Ambulatory Visit: Payer: Self-pay

## 2019-08-16 DIAGNOSIS — C61 Malignant neoplasm of prostate: Secondary | ICD-10-CM | POA: Diagnosis not present

## 2019-08-16 DIAGNOSIS — Z510 Encounter for antineoplastic radiation therapy: Secondary | ICD-10-CM | POA: Diagnosis not present

## 2019-08-17 ENCOUNTER — Ambulatory Visit
Admission: RE | Admit: 2019-08-17 | Discharge: 2019-08-17 | Disposition: A | Discharge status: Home / Self Care | Payer: Medicare Other | Source: Ambulatory Visit | Attending: Radiation Oncology | Admitting: Radiation Oncology

## 2019-08-17 ENCOUNTER — Other Ambulatory Visit: Payer: Self-pay

## 2019-08-17 DIAGNOSIS — C61 Malignant neoplasm of prostate: Secondary | ICD-10-CM | POA: Diagnosis not present

## 2019-08-17 DIAGNOSIS — Z510 Encounter for antineoplastic radiation therapy: Secondary | ICD-10-CM | POA: Diagnosis not present

## 2019-08-18 ENCOUNTER — Ambulatory Visit
Admission: RE | Admit: 2019-08-18 | Discharge: 2019-08-18 | Disposition: A | Discharge status: Home / Self Care | Payer: Medicare Other | Source: Ambulatory Visit | Attending: Radiation Oncology | Admitting: Radiation Oncology

## 2019-08-18 ENCOUNTER — Other Ambulatory Visit: Payer: Self-pay

## 2019-08-18 DIAGNOSIS — Z510 Encounter for antineoplastic radiation therapy: Secondary | ICD-10-CM | POA: Diagnosis not present

## 2019-08-18 DIAGNOSIS — C61 Malignant neoplasm of prostate: Secondary | ICD-10-CM | POA: Diagnosis not present

## 2019-08-21 ENCOUNTER — Ambulatory Visit
Admission: RE | Admit: 2019-08-21 | Discharge: 2019-08-21 | Disposition: A | Discharge status: Home / Self Care | Payer: Medicare Other | Source: Ambulatory Visit | Attending: Radiation Oncology | Admitting: Radiation Oncology

## 2019-08-21 ENCOUNTER — Other Ambulatory Visit: Payer: Self-pay

## 2019-08-21 DIAGNOSIS — C61 Malignant neoplasm of prostate: Secondary | ICD-10-CM | POA: Diagnosis not present

## 2019-08-21 DIAGNOSIS — Z510 Encounter for antineoplastic radiation therapy: Secondary | ICD-10-CM | POA: Diagnosis not present

## 2019-08-22 ENCOUNTER — Other Ambulatory Visit: Payer: Self-pay

## 2019-08-22 ENCOUNTER — Ambulatory Visit
Admission: RE | Admit: 2019-08-22 | Discharge: 2019-08-22 | Disposition: A | Discharge status: Home / Self Care | Payer: Medicare Other | Source: Ambulatory Visit | Attending: Radiation Oncology | Admitting: Radiation Oncology

## 2019-08-22 DIAGNOSIS — Z510 Encounter for antineoplastic radiation therapy: Secondary | ICD-10-CM | POA: Diagnosis not present

## 2019-08-22 DIAGNOSIS — C61 Malignant neoplasm of prostate: Secondary | ICD-10-CM | POA: Diagnosis not present

## 2019-08-23 ENCOUNTER — Ambulatory Visit
Admission: RE | Admit: 2019-08-23 | Discharge: 2019-08-23 | Disposition: A | Discharge status: Home / Self Care | Payer: Medicare Other | Source: Ambulatory Visit | Attending: Radiation Oncology | Admitting: Radiation Oncology

## 2019-08-23 ENCOUNTER — Other Ambulatory Visit: Payer: Self-pay

## 2019-08-23 DIAGNOSIS — Z510 Encounter for antineoplastic radiation therapy: Secondary | ICD-10-CM | POA: Diagnosis not present

## 2019-08-23 DIAGNOSIS — C61 Malignant neoplasm of prostate: Secondary | ICD-10-CM | POA: Diagnosis not present

## 2019-08-24 ENCOUNTER — Other Ambulatory Visit: Payer: Self-pay

## 2019-08-24 ENCOUNTER — Ambulatory Visit
Admission: RE | Admit: 2019-08-24 | Discharge: 2019-08-24 | Disposition: A | Discharge status: Home / Self Care | Payer: Medicare Other | Source: Ambulatory Visit | Attending: Radiation Oncology | Admitting: Radiation Oncology

## 2019-08-24 DIAGNOSIS — Z510 Encounter for antineoplastic radiation therapy: Secondary | ICD-10-CM | POA: Diagnosis not present

## 2019-08-24 DIAGNOSIS — C61 Malignant neoplasm of prostate: Secondary | ICD-10-CM | POA: Diagnosis not present

## 2019-08-25 ENCOUNTER — Other Ambulatory Visit: Payer: Self-pay

## 2019-08-25 ENCOUNTER — Ambulatory Visit
Admission: RE | Admit: 2019-08-25 | Discharge: 2019-08-25 | Disposition: A | Discharge status: Home / Self Care | Payer: Medicare Other | Source: Ambulatory Visit | Attending: Radiation Oncology | Admitting: Radiation Oncology

## 2019-08-25 DIAGNOSIS — Z510 Encounter for antineoplastic radiation therapy: Secondary | ICD-10-CM | POA: Diagnosis not present

## 2019-08-25 DIAGNOSIS — C61 Malignant neoplasm of prostate: Secondary | ICD-10-CM | POA: Diagnosis not present

## 2019-08-28 ENCOUNTER — Other Ambulatory Visit: Payer: Self-pay

## 2019-08-28 ENCOUNTER — Ambulatory Visit
Admission: RE | Admit: 2019-08-28 | Discharge: 2019-08-28 | Disposition: A | Discharge status: Home / Self Care | Payer: Medicare Other | Source: Ambulatory Visit | Attending: Radiation Oncology | Admitting: Radiation Oncology

## 2019-08-28 DIAGNOSIS — C61 Malignant neoplasm of prostate: Secondary | ICD-10-CM | POA: Diagnosis not present

## 2019-08-28 DIAGNOSIS — Z510 Encounter for antineoplastic radiation therapy: Secondary | ICD-10-CM | POA: Diagnosis not present

## 2019-08-29 ENCOUNTER — Encounter: Payer: Self-pay | Admitting: Medical Oncology

## 2019-08-29 ENCOUNTER — Ambulatory Visit
Admission: RE | Admit: 2019-08-29 | Discharge: 2019-08-29 | Disposition: A | Discharge status: Home / Self Care | Payer: Medicare Other | Source: Ambulatory Visit | Attending: Radiation Oncology | Admitting: Radiation Oncology

## 2019-08-29 ENCOUNTER — Other Ambulatory Visit: Payer: Self-pay

## 2019-08-29 DIAGNOSIS — C61 Malignant neoplasm of prostate: Secondary | ICD-10-CM | POA: Diagnosis not present

## 2019-08-29 DIAGNOSIS — Z510 Encounter for antineoplastic radiation therapy: Secondary | ICD-10-CM | POA: Diagnosis not present

## 2019-08-30 ENCOUNTER — Other Ambulatory Visit: Payer: Self-pay

## 2019-08-30 ENCOUNTER — Ambulatory Visit
Admission: RE | Admit: 2019-08-30 | Discharge: 2019-08-30 | Disposition: A | Discharge status: Home / Self Care | Payer: Medicare Other | Source: Ambulatory Visit | Attending: Radiation Oncology | Admitting: Radiation Oncology

## 2019-08-30 DIAGNOSIS — Z510 Encounter for antineoplastic radiation therapy: Secondary | ICD-10-CM | POA: Diagnosis not present

## 2019-08-30 DIAGNOSIS — C61 Malignant neoplasm of prostate: Secondary | ICD-10-CM | POA: Diagnosis not present

## 2019-08-31 ENCOUNTER — Ambulatory Visit
Admission: RE | Admit: 2019-08-31 | Discharge: 2019-08-31 | Disposition: A | Discharge status: Home / Self Care | Payer: Medicare Other | Source: Ambulatory Visit | Attending: Radiation Oncology | Admitting: Radiation Oncology

## 2019-08-31 ENCOUNTER — Other Ambulatory Visit: Payer: Self-pay

## 2019-08-31 DIAGNOSIS — C61 Malignant neoplasm of prostate: Secondary | ICD-10-CM | POA: Diagnosis not present

## 2019-08-31 DIAGNOSIS — Z510 Encounter for antineoplastic radiation therapy: Secondary | ICD-10-CM | POA: Diagnosis not present

## 2019-09-01 ENCOUNTER — Ambulatory Visit
Admission: RE | Admit: 2019-09-01 | Discharge: 2019-09-01 | Disposition: A | Discharge status: Home / Self Care | Payer: Medicare Other | Source: Ambulatory Visit | Attending: Radiation Oncology | Admitting: Radiation Oncology

## 2019-09-01 ENCOUNTER — Other Ambulatory Visit: Payer: Self-pay

## 2019-09-01 DIAGNOSIS — C61 Malignant neoplasm of prostate: Secondary | ICD-10-CM | POA: Diagnosis not present

## 2019-09-01 DIAGNOSIS — Z510 Encounter for antineoplastic radiation therapy: Secondary | ICD-10-CM | POA: Diagnosis not present

## 2019-09-04 ENCOUNTER — Ambulatory Visit
Admission: RE | Admit: 2019-09-04 | Discharge: 2019-09-04 | Disposition: A | Discharge status: Home / Self Care | Payer: Medicare Other | Source: Ambulatory Visit | Attending: Radiation Oncology | Admitting: Radiation Oncology

## 2019-09-04 ENCOUNTER — Other Ambulatory Visit: Payer: Self-pay

## 2019-09-04 DIAGNOSIS — Z510 Encounter for antineoplastic radiation therapy: Secondary | ICD-10-CM | POA: Diagnosis not present

## 2019-09-04 DIAGNOSIS — C61 Malignant neoplasm of prostate: Secondary | ICD-10-CM | POA: Diagnosis not present

## 2019-09-05 ENCOUNTER — Other Ambulatory Visit: Payer: Self-pay

## 2019-09-05 ENCOUNTER — Ambulatory Visit
Admission: RE | Admit: 2019-09-05 | Discharge: 2019-09-05 | Disposition: A | Discharge status: Home / Self Care | Payer: Medicare Other | Source: Ambulatory Visit | Attending: Radiation Oncology | Admitting: Radiation Oncology

## 2019-09-05 DIAGNOSIS — C61 Malignant neoplasm of prostate: Secondary | ICD-10-CM | POA: Diagnosis not present

## 2019-09-05 DIAGNOSIS — Z510 Encounter for antineoplastic radiation therapy: Secondary | ICD-10-CM | POA: Diagnosis not present

## 2019-09-06 ENCOUNTER — Ambulatory Visit
Admission: RE | Admit: 2019-09-06 | Discharge: 2019-09-06 | Disposition: A | Discharge status: Home / Self Care | Payer: Medicare Other | Source: Ambulatory Visit | Attending: Radiation Oncology | Admitting: Radiation Oncology

## 2019-09-06 ENCOUNTER — Other Ambulatory Visit: Payer: Self-pay

## 2019-09-06 DIAGNOSIS — C61 Malignant neoplasm of prostate: Secondary | ICD-10-CM | POA: Diagnosis not present

## 2019-09-06 DIAGNOSIS — Z510 Encounter for antineoplastic radiation therapy: Secondary | ICD-10-CM | POA: Diagnosis not present

## 2019-09-07 ENCOUNTER — Encounter: Payer: Self-pay | Admitting: Radiation Oncology

## 2019-09-07 ENCOUNTER — Ambulatory Visit
Admission: RE | Admit: 2019-09-07 | Discharge: 2019-09-07 | Disposition: A | Discharge status: Home / Self Care | Payer: Medicare Other | Source: Ambulatory Visit | Attending: Radiation Oncology | Admitting: Radiation Oncology

## 2019-09-07 ENCOUNTER — Encounter: Payer: Self-pay | Admitting: Medical Oncology

## 2019-09-07 ENCOUNTER — Other Ambulatory Visit: Payer: Self-pay

## 2019-09-07 DIAGNOSIS — C61 Malignant neoplasm of prostate: Secondary | ICD-10-CM | POA: Diagnosis not present

## 2019-09-07 DIAGNOSIS — Z510 Encounter for antineoplastic radiation therapy: Secondary | ICD-10-CM | POA: Diagnosis not present

## 2019-09-26 DIAGNOSIS — L578 Other skin changes due to chronic exposure to nonionizing radiation: Secondary | ICD-10-CM | POA: Diagnosis not present

## 2019-09-26 DIAGNOSIS — D2272 Melanocytic nevi of left lower limb, including hip: Secondary | ICD-10-CM | POA: Diagnosis not present

## 2019-09-26 DIAGNOSIS — B078 Other viral warts: Secondary | ICD-10-CM | POA: Diagnosis not present

## 2019-09-26 DIAGNOSIS — L821 Other seborrheic keratosis: Secondary | ICD-10-CM | POA: Diagnosis not present

## 2019-09-26 DIAGNOSIS — D1801 Hemangioma of skin and subcutaneous tissue: Secondary | ICD-10-CM | POA: Diagnosis not present

## 2019-09-26 DIAGNOSIS — L814 Other melanin hyperpigmentation: Secondary | ICD-10-CM | POA: Diagnosis not present

## 2019-09-27 NOTE — Progress Notes (Signed)
  Radiation Oncology         [phone]) [phone] ________________________________  Name: Terry [name] MRN: [id number]  Date: 09/07/2019  DOB: 10-17-1940  End of Treatment Note  Diagnosis:   79 y.o. gentleman with Stage T1c adenocarcinoma of the prostate with Gleason score of 4+3, and PSA of 4.61 on finasteride     Indication for treatment:  Curative, Definitive Radiotherapy       Radiation treatment dates:   2/16-3/25/21  Site/dose:   The prostate was treated to 70 Gy in 28 fractions of 2.5 Gy  Beams/energy:   The patient was treated with IMRT using volumetric arc therapy delivering 6 MV X-rays to clockwise and counterclockwise circumferential arcs with a 90 degree collimator offset to avoid dose scalloping.  Image guidance was performed with daily cone beam CT prior to each fraction to align to gold markers in the prostate and assure proper bladder and rectal fill volumes.  Immobilization was achieved with BodyFix custom mold.  Narrative: The patient tolerated radiation treatment relatively well.   The patient experienced some minor urinary irritation and modest fatigue.    Plan: The patient has completed radiation treatment. He will return to radiation oncology clinic for routine followup in one month. I advised him to call or return sooner if he has any questions or concerns related to his recovery or treatment. ________________________________  Sheral Apley. Tammi Klippel, M.D.

## 2019-10-10 ENCOUNTER — Telehealth: Payer: Self-pay

## 2019-10-10 NOTE — Telephone Encounter (Signed)
Spoke with patient about follow up appointment on 10/11/19 patient verbalized this is a telephone encounter for imaging results

## 2019-10-11 ENCOUNTER — Other Ambulatory Visit: Payer: Self-pay

## 2019-10-11 ENCOUNTER — Telehealth: Payer: Self-pay

## 2019-10-11 ENCOUNTER — Ambulatory Visit
Admission: RE | Admit: 2019-10-11 | Discharge: 2019-10-11 | Disposition: A | Discharge status: Home / Self Care | Payer: Medicare Other | Source: Ambulatory Visit | Attending: Radiation Oncology | Admitting: Radiation Oncology

## 2019-10-11 DIAGNOSIS — Z510 Encounter for antineoplastic radiation therapy: Secondary | ICD-10-CM | POA: Insufficient documentation

## 2019-10-11 DIAGNOSIS — C61 Malignant neoplasm of prostate: Secondary | ICD-10-CM

## 2019-10-11 NOTE — Progress Notes (Signed)
Reports no hematuria dysuria or leakage.Feels he does not always empty his bladder when he urinates has urgency. Does not currently have an follow up appointment scheduled with his urologist at this time.

## 2019-10-11 NOTE — Progress Notes (Signed)
Radiation Oncology         [phone]) [phone] ________________________________  Name: Terry [name] MRN: [id number]  Date: 10/11/2019  DOB: 08/09/1940  Post Treatment Note  CC: Patient, No Pcp Per  Franchot Gallo, MD  Diagnosis:   79 y.o. gentleman with Stage T1c adenocarcinoma of the prostate with Gleason score of 4+3, and PSA of 4.61 on finasteride.     Interval Since Last Radiation:  5 weeks  2/16-3/25/21:   The prostate was treated to 70 Gy in 28 fractions of 2.5 Gy  Narrative:  The patient returns today for routine follow-up.  He tolerated radiation treatment relatively well with only minor urinary irritation with urgency and nocturia 2/night as well as mild fatigue.  He did also experience some hesitancy and weakened flow of stream but this improved with the addition of Motrin to his daily Flomax regimen.  His pre-treatment IPSS was 12 despite taking Flomax and Finasteride daily.                          On review of systems, the patient states that his LUTS are gradually improving and tolerable at this point  His current IPSS is 15, indicating moderate urinary bother with increased frequency and slow stream. He specifically denies gross hematuria, hematochezia, dysuria, urgency, incomplete bladder emptying or incontinence.  He continues taking the flomax and finasteride daily. Overall, he is quite pleased with his progress to date.  ALLERGIES:  has No Known Allergies.  Meds: Current Outpatient Medications  Medication Sig Dispense Refill  . atorvastatin (LIPITOR) 10 MG tablet Take 10 mg by mouth daily.    . CONTOUR NEXT TEST test strip CHECK BLOOD SUGARS ONCE IN THE MORNING IN VITRO **E11.22**    . finasteride (PROSCAR) 5 MG tablet Take 5 mg by mouth daily.    Marland Kitchen lisinopril (ZESTRIL) 20 MG tablet Take 20 mg by mouth daily.    . metFORMIN (GLUCOPHAGE) 500 MG tablet Take 500 mg by mouth daily with breakfast.     . tamsulosin (FLOMAX) 0.4 MG CAPS capsule Take 0.4 mg by mouth daily.       No current facility-administered medications for this encounter.    Physical Findings:  vitals were not taken for this visit.   /Unable to assess due to telephone follow up visit format.  Lab Findings: No results found for: WBC, HGB, HCT, MCV, PLT   Radiographic Findings: No results found.  Impression/Plan: 1. 79 y.o. gentleman with Stage T1c adenocarcinoma of the prostate with Gleason score of 4+3, and PSA of 4.61 on finasteride. He will continue to follow up with urology for ongoing PSA determinations but does not currently have a follow up appointment scheduled with Dr. Diona Fanti to his knowledge. I advised that he would likely be seen in follow up approximately 3 months out from completing treatment, with a PSA prior to that visit and if he does not hear from Dr. Alan Ripper nurse in the next couple of weeks, to reach out and get scheduled for late June/early July.  He understands what to expect with regards to PSA monitoring going forward. I will look forward to following his response to treatment via correspondence with urology, and would be happy to continue to participate in his care if clinically indicated. I talked to the patient about what to expect in the future, including his risk for erectile dysfunction and rectal bleeding. I encouraged him to call or return to the office if he has  any questions regarding his previous radiation or possible radiation side effects. He was comfortable with this plan and will follow up as needed.     Today, a comprehensive survivorship care plan and treatment summary was mailed to and reviewed with the patient today detailing his prostate cancer diagnosis, treatment course, potential late/long-term effects of treatment, appropriate follow-up care with recommendations for the future, and patient education resources.  A copy of this summary, along with a letter will be sent to the patient's urologist via In Basket message after today's visit.  He does  not currently have a primary care provider.  2. Cancer screening:  Due to Mr. Montemayor history and his age, he should receive screening for skin cancers and colon cancer.  The information and recommendations are listed on the patient's comprehensive care plan/treatment summary and were reviewed in detail with the patient.      3. Health maintenance and wellness promotion: Mr. Courteau was encouraged to consume 5-7 servings of fruits and vegetables per day. He was provided a copy of the "Nutrition Rainbow" handout, as well as the handout "Take Control of Your Health and [place]" from the [employer].  He was also encouraged to engage in moderate to vigorous exercise for 30 minutes per day most days of the week. Information was provided regarding the [facility] fitness program, which is designed for cancer survivors to help them become more physically fit after cancer treatments. We discussed that a healthy BMI is 18.5-24.9 and that maintaining a healthy weight reduces risk of cancer recurrences.  He was instructed to limit his alcohol consumption and continue to abstain from tobacco use.  Lastly, he was encouraged to use sunscreen and wear protective clothing when in the sun.     4. Support services/counseling: It is not uncommon for this period of the patient's cancer care trajectory to be one of many emotions and stressors.  Mr. Torrente was encouraged to take advantage of our many support services programs, support groups, and/or counseling in coping with his new life as a cancer survivor after completing anti-cancer treatment.  He was offered support today through active listening and expressive supportive counseling.  He was given information regarding our available services and encouraged to contact me with any questions or for help enrolling in any of our support group/programs.      Nicholos Johns, PA-C

## 2019-11-14 NOTE — Telephone Encounter (Signed)
Appointment reminder

## 2019-11-22 DIAGNOSIS — Z7984 Long term (current) use of oral hypoglycemic drugs: Secondary | ICD-10-CM | POA: Diagnosis not present

## 2019-11-22 DIAGNOSIS — E1122 Type 2 diabetes mellitus with diabetic chronic kidney disease: Secondary | ICD-10-CM | POA: Diagnosis not present

## 2019-11-22 DIAGNOSIS — I129 Hypertensive chronic kidney disease with stage 1 through stage 4 chronic kidney disease, or unspecified chronic kidney disease: Secondary | ICD-10-CM | POA: Diagnosis not present

## 2019-11-22 DIAGNOSIS — N1831 Chronic kidney disease, stage 3a: Secondary | ICD-10-CM | POA: Diagnosis not present

## 2019-11-22 DIAGNOSIS — Z23 Encounter for immunization: Secondary | ICD-10-CM | POA: Diagnosis not present

## 2019-11-22 DIAGNOSIS — Z79899 Other long term (current) drug therapy: Secondary | ICD-10-CM | POA: Diagnosis not present

## 2019-12-21 DIAGNOSIS — C61 Malignant neoplasm of prostate: Secondary | ICD-10-CM | POA: Diagnosis not present

## 2019-12-29 DIAGNOSIS — C61 Malignant neoplasm of prostate: Secondary | ICD-10-CM | POA: Diagnosis not present

## 2019-12-29 DIAGNOSIS — N281 Cyst of kidney, acquired: Secondary | ICD-10-CM | POA: Diagnosis not present

## 2020-01-29 DIAGNOSIS — I129 Hypertensive chronic kidney disease with stage 1 through stage 4 chronic kidney disease, or unspecified chronic kidney disease: Secondary | ICD-10-CM | POA: Diagnosis not present

## 2020-01-29 DIAGNOSIS — N1832 Chronic kidney disease, stage 3b: Secondary | ICD-10-CM | POA: Diagnosis not present

## 2020-01-29 DIAGNOSIS — E7800 Pure hypercholesterolemia, unspecified: Secondary | ICD-10-CM | POA: Diagnosis not present

## 2020-01-29 DIAGNOSIS — E1122 Type 2 diabetes mellitus with diabetic chronic kidney disease: Secondary | ICD-10-CM | POA: Diagnosis not present

## 2020-04-11 DIAGNOSIS — N1832 Chronic kidney disease, stage 3b: Secondary | ICD-10-CM | POA: Diagnosis not present

## 2020-04-11 DIAGNOSIS — E7800 Pure hypercholesterolemia, unspecified: Secondary | ICD-10-CM | POA: Diagnosis not present

## 2020-04-11 DIAGNOSIS — E1122 Type 2 diabetes mellitus with diabetic chronic kidney disease: Secondary | ICD-10-CM | POA: Diagnosis not present

## 2020-04-11 DIAGNOSIS — I129 Hypertensive chronic kidney disease with stage 1 through stage 4 chronic kidney disease, or unspecified chronic kidney disease: Secondary | ICD-10-CM | POA: Diagnosis not present

## 2020-04-12 DIAGNOSIS — Z23 Encounter for immunization: Secondary | ICD-10-CM | POA: Diagnosis not present

## 2020-04-16 IMAGING — NM NM BONE WHOLE BODY
2 series · 2 of 2 positions shown · non-contrast
Comparison: None available

CLINICAL DATA: No scintigraphic evidence skeletal metastasis.

EXAM:
NUCLEAR MEDICINE WHOLE BODY BONE SCAN
TECHNIQUE: Whole body anterior and posterior images were obtained approximately
3 hours after intravenous injection of radiopharmaceutical.
RADIOPHARMACEUTICALS:  20.9 mCi Mechnetium-EEm MDP IV

[Series 1: whole body · 2.66mm/px · 1 of 1 slices shown (1 of 2)]
[im 1/1]
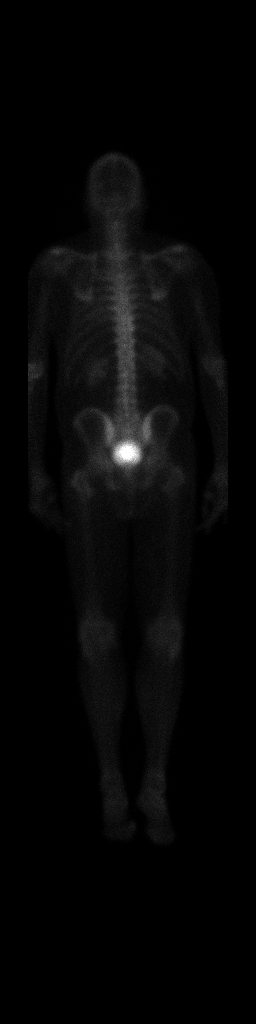

[Series 1: whole body · 2.66mm/px · 1 of 1 slices shown (2 of 2)]
[im 1/1]
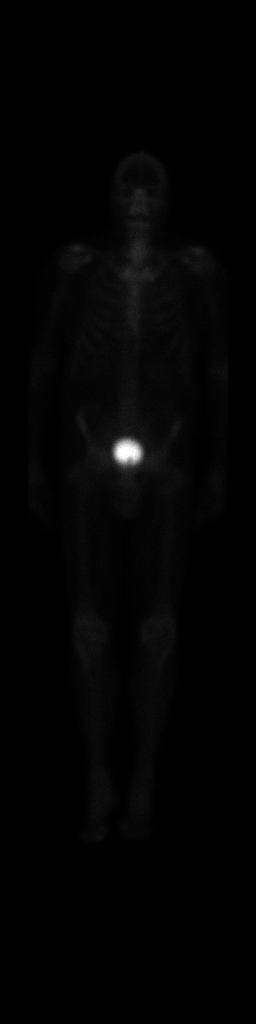

[2 of 2 positions shown; findings below may reference images not displayed]

FINDINGS: No foci of radiotracer accumulation within the axillary appendicular
skeleton to suggest metastasis. The sacrum is obscured by high
concentration radioactive urine
IMPRESSION: No evidence of skeletal metastasis.

## 2020-04-30 DIAGNOSIS — R972 Elevated prostate specific antigen [PSA]: Secondary | ICD-10-CM | POA: Diagnosis not present

## 2020-04-30 DIAGNOSIS — C61 Malignant neoplasm of prostate: Secondary | ICD-10-CM | POA: Diagnosis not present

## 2020-05-01 DIAGNOSIS — Z23 Encounter for immunization: Secondary | ICD-10-CM | POA: Diagnosis not present

## 2020-05-23 DIAGNOSIS — N281 Cyst of kidney, acquired: Secondary | ICD-10-CM | POA: Diagnosis not present

## 2020-05-23 DIAGNOSIS — L821 Other seborrheic keratosis: Secondary | ICD-10-CM | POA: Diagnosis not present

## 2020-05-23 DIAGNOSIS — Z1389 Encounter for screening for other disorder: Secondary | ICD-10-CM | POA: Diagnosis not present

## 2020-05-23 DIAGNOSIS — L820 Inflamed seborrheic keratosis: Secondary | ICD-10-CM | POA: Diagnosis not present

## 2020-05-23 DIAGNOSIS — I700 Atherosclerosis of aorta: Secondary | ICD-10-CM | POA: Diagnosis not present

## 2020-05-23 DIAGNOSIS — E1122 Type 2 diabetes mellitus with diabetic chronic kidney disease: Secondary | ICD-10-CM | POA: Diagnosis not present

## 2020-05-23 DIAGNOSIS — L578 Other skin changes due to chronic exposure to nonionizing radiation: Secondary | ICD-10-CM | POA: Diagnosis not present

## 2020-05-23 DIAGNOSIS — I129 Hypertensive chronic kidney disease with stage 1 through stage 4 chronic kidney disease, or unspecified chronic kidney disease: Secondary | ICD-10-CM | POA: Diagnosis not present

## 2020-05-23 DIAGNOSIS — Z7984 Long term (current) use of oral hypoglycemic drugs: Secondary | ICD-10-CM | POA: Diagnosis not present

## 2020-05-23 DIAGNOSIS — L570 Actinic keratosis: Secondary | ICD-10-CM | POA: Diagnosis not present

## 2020-05-23 DIAGNOSIS — Z79899 Other long term (current) drug therapy: Secondary | ICD-10-CM | POA: Diagnosis not present

## 2020-05-23 DIAGNOSIS — E7800 Pure hypercholesterolemia, unspecified: Secondary | ICD-10-CM | POA: Diagnosis not present

## 2020-05-23 DIAGNOSIS — Z0000 Encounter for general adult medical examination without abnormal findings: Secondary | ICD-10-CM | POA: Diagnosis not present

## 2020-05-23 DIAGNOSIS — D225 Melanocytic nevi of trunk: Secondary | ICD-10-CM | POA: Diagnosis not present

## 2020-05-23 DIAGNOSIS — N1832 Chronic kidney disease, stage 3b: Secondary | ICD-10-CM | POA: Diagnosis not present

## 2020-06-24 DIAGNOSIS — H524 Presbyopia: Secondary | ICD-10-CM | POA: Diagnosis not present

## 2020-06-24 DIAGNOSIS — E119 Type 2 diabetes mellitus without complications: Secondary | ICD-10-CM | POA: Diagnosis not present

## 2020-07-03 DIAGNOSIS — C61 Malignant neoplasm of prostate: Secondary | ICD-10-CM | POA: Diagnosis not present

## 2020-07-03 DIAGNOSIS — K4020 Bilateral inguinal hernia, without obstruction or gangrene, not specified as recurrent: Secondary | ICD-10-CM | POA: Diagnosis not present

## 2020-07-03 DIAGNOSIS — N281 Cyst of kidney, acquired: Secondary | ICD-10-CM | POA: Diagnosis not present

## 2020-07-08 DIAGNOSIS — N281 Cyst of kidney, acquired: Secondary | ICD-10-CM | POA: Diagnosis not present

## 2020-07-08 DIAGNOSIS — K4020 Bilateral inguinal hernia, without obstruction or gangrene, not specified as recurrent: Secondary | ICD-10-CM | POA: Diagnosis not present

## 2020-07-08 DIAGNOSIS — I700 Atherosclerosis of aorta: Secondary | ICD-10-CM | POA: Diagnosis not present

## 2020-07-08 DIAGNOSIS — K8020 Calculus of gallbladder without cholecystitis without obstruction: Secondary | ICD-10-CM | POA: Diagnosis not present

## 2020-07-29 DIAGNOSIS — N1832 Chronic kidney disease, stage 3b: Secondary | ICD-10-CM | POA: Diagnosis not present

## 2020-07-29 DIAGNOSIS — E1122 Type 2 diabetes mellitus with diabetic chronic kidney disease: Secondary | ICD-10-CM | POA: Diagnosis not present

## 2020-07-29 DIAGNOSIS — E7800 Pure hypercholesterolemia, unspecified: Secondary | ICD-10-CM | POA: Diagnosis not present

## 2020-07-29 DIAGNOSIS — I129 Hypertensive chronic kidney disease with stage 1 through stage 4 chronic kidney disease, or unspecified chronic kidney disease: Secondary | ICD-10-CM | POA: Diagnosis not present

## 2020-09-03 ENCOUNTER — Ambulatory Visit: Payer: Self-pay | Admitting: Surgery

## 2020-09-03 DIAGNOSIS — C61 Malignant neoplasm of prostate: Secondary | ICD-10-CM | POA: Diagnosis not present

## 2020-09-03 DIAGNOSIS — K429 Umbilical hernia without obstruction or gangrene: Secondary | ICD-10-CM | POA: Diagnosis not present

## 2020-09-03 DIAGNOSIS — K4021 Bilateral inguinal hernia, without obstruction or gangrene, recurrent: Secondary | ICD-10-CM | POA: Diagnosis not present

## 2020-09-03 DIAGNOSIS — N2889 Other specified disorders of kidney and ureter: Secondary | ICD-10-CM | POA: Diagnosis not present

## 2020-09-03 NOTE — H&P (Signed)
Terry [name] Appointment: 09/03/2020 9:30 AM Location: [facility] Patient #: [id number] DOB: Oct 18, 1940 Married / Language: Terry [name] / Race: White Male  History of Present Illness Terry Hector MD; 09/03/2020 10:00 AM) The patient is a 80 year old male who presents with an inguinal hernia. Note for "Inguinal hernia": ` ` ` Patient sent for surgical consultation at the request of Dr Diona Fanti, [facility]  Chief Complaint: Groin hernias. ` ` The patient is a pleasant elderly active male. History of prostate cancer treated with radiation therapy by Dr. Tyler Pita last year. Had Gleason score of 7. His to have some nocturia but has noted that that is actually improved to the point that he is not even taking his Flomax anymore. Noted some groin swelling and discomfort the past few years. Right-sided getting larger and more bothersome. Discuss with his urologist. Inguinal hernias suspected on exam and CT scan. Surgical consultation requested. Patient can walk half hour without difficulty. Sexually active. No history of heart attack or stroke. He does have borderline diabetes that is well controlled with diet and daily metformin. No sleep apnea. He does not smoke. No history of any abdominal or hernia or groin surgery. Usually moves his bowels every day. He's found to have a 4 cm mass on the right kidney that is stable on follow-up CT scan 2 years later. Followed by urology. Patient denies any history of urinary tract infection. He is not immunosuppressed or HIV positive. Has not had any prior hernia surgery.  (Review of systems as stated in this history (HPI) or in the review of systems. Otherwise all other 12 point ROS are negative) ` ` ###########################################`  This patient encounter took 25 minutes today to perform the following: obtain history, perform exam, review outside records, interpret tests & imaging, counsel the patient  on their diagnosis; and, document this encounter, including findings & plan in the electronic health record (EHR).   Past Surgical History [name], CNA; 09/03/2020 9:16 AM) Cataract Surgery Bilateral. Colon Polyp Removal - Colonoscopy Tonsillectomy Vasectomy  Diagnostic Studies History [name], CNA; 09/03/2020 9:16 AM) Colonoscopy 1-5 years ago  Allergies [name], CNA; 09/03/2020 9:16 AM) No Known Drug Allergies [09/03/2020]: Allergies Reconciled  Medication History [name], CNA; 09/03/2020 9:16 AM) Atorvastatin Calcium (10MG  Tablet, Oral) Active. Contour Next Test (In Vitro) Active. Finasteride (5MG  Tablet, Oral) Active. Lisinopril (20MG  Tablet, Oral) Active. metFORMIN HCl (500MG  Tablet, Oral) Active. Tamsulosin HCl (0.4MG  Capsule, Oral) Active. Medications Reconciled  Social History [name], CNA; 09/03/2020 9:16 AM) Alcohol use Occasional alcohol use. Caffeine use Carbonated beverages, Coffee, Tea. No drug use Tobacco use Never smoker.  Family History [name], CNA; 09/03/2020 9:16 AM) Arthritis Father, Sister. Breast Cancer Sister. Cancer Father. Cerebrovascular Accident Mother. Diabetes Mellitus Father. Heart Disease Father. Hypertension Daughter, Father, Mother.  Other Problems [name], CNA; 09/03/2020 9:16 AM) Diabetes Mellitus Enlarged Prostate Gastroesophageal Reflux Disease High blood pressure Hypercholesterolemia Inguinal Hernia Prostate Cancer     Review of Systems [name] CNA; 09/03/2020 9:16 AM) General Present- Fatigue. Not Present- Appetite Loss, Chills, Fever, Night Sweats, Weight Gain and Weight Loss. Skin Not Present- Change in Wart/Mole, Dryness, Hives, Jaundice, New Lesions, Non-Healing Wounds, Rash and Ulcer. HEENT Present- Wears glasses/contact lenses. Not Present- Earache, Hearing Loss, Hoarseness, Nose Bleed, Oral Ulcers, Ringing in the Ears,  Seasonal Allergies, Sinus Pain, Sore Throat, Visual Disturbances and Yellow Eyes. Respiratory Not Present- Bloody sputum, Chronic Cough, Difficulty Breathing, Snoring and Wheezing. Breast Not Present- Breast Mass,  Breast Pain, Nipple Discharge and Skin Changes. Cardiovascular Present- Leg Cramps and Swelling of Extremities. Not Present- Chest Pain, Difficulty Breathing Lying Down, Palpitations, Rapid Heart Rate and Shortness of Breath. Gastrointestinal Not Present- Abdominal Pain, Bloating, Bloody Stool, Change in Bowel Habits, Chronic diarrhea, Constipation, Difficulty Swallowing, Excessive gas, Gets full quickly at meals, Hemorrhoids, Indigestion, Nausea, Rectal Pain and Vomiting. Male Genitourinary Present- Urgency. Not Present- Blood in Urine, Change in Urinary Stream, Frequency, Impotence, Nocturia, Painful Urination and Urine Leakage. Musculoskeletal Not Present- Back Pain, Joint Pain, Joint Stiffness, Muscle Pain, Muscle Weakness and Swelling of Extremities. Neurological Present- Numbness. Not Present- Decreased Memory, Fainting, Headaches, Seizures, Tingling, Tremor, Trouble walking and Weakness. Psychiatric Not Present- Anxiety, Bipolar, Change in Sleep Pattern, Depression, Fearful and Frequent crying. Endocrine Not Present- Cold Intolerance, Excessive Hunger, Hair Changes, Heat Intolerance, Hot flashes and New Diabetes. Hematology Not Present- Blood Thinners, Easy Bruising, Excessive bleeding, Gland problems, HIV and Persistent Infections.  Vitals Adriana Reams Alston CNA; 09/03/2020 9:17 AM) 09/03/2020 9:16 AM Weight: 172.13 lb Height: 72in Body Surface Area: 2 m Body Mass Index: 23.34 kg/m  Temp.: 98.15F  Pulse: 91 (Regular)  P.OX: 96% (Room air) BP: 130/80(Sitting, Left Arm, Standard)        Physical Exam Terry Hector MD; 09/03/2020 10:01 AM)  General Mental Status-Alert. General Appearance-Not in acute distress, Not Sickly. Orientation-Oriented  X3. Hydration-Well hydrated. Voice-Normal.  Integumentary Global Assessment Upon inspection and palpation of skin surfaces of the - Axillae: non-tender, no inflammation or ulceration, no drainage. and Distribution of scalp and body hair is normal. General Characteristics Temperature - normal warmth is noted.  Head and Neck Head-normocephalic, atraumatic with no lesions or palpable masses. Face Global Assessment - atraumatic, no absence of expression. Neck Global Assessment - no abnormal movements, no bruit auscultated on the right, no bruit auscultated on the left, no decreased range of motion, non-tender. Trachea-midline. Thyroid Gland Characteristics - non-tender.  Eye Eyeball - Left-Extraocular movements intact, No Nystagmus - Left. Eyeball - Right-Extraocular movements intact, No Nystagmus - Right. Cornea - Left-No Hazy - Left. Cornea - Right-No Hazy - Right. Sclera/Conjunctiva - Left-No scleral icterus, No Discharge - Left. Sclera/Conjunctiva - Right-No scleral icterus, No Discharge - Right. Pupil - Left-Direct reaction to light normal. Pupil - Right-Direct reaction to light normal.  ENMT Ears Pinna - Left - no drainage observed, no generalized tenderness observed. Pinna - Right - no drainage observed, no generalized tenderness observed. Nose and Sinuses External Inspection of the Nose - no destructive lesion observed. Inspection of the nares - Left - quiet respiration. Inspection of the nares - Right - quiet respiration. Mouth and Throat Lips - Upper Lip - no fissures observed, no pallor noted. Lower Lip - no fissures observed, no pallor noted. Nasopharynx - no discharge present. Oral Cavity/Oropharynx - Tongue - no dryness observed. Oral Mucosa - no cyanosis observed. Hypopharynx - no evidence of airway distress observed.  Chest and Lung Exam Inspection Movements - Normal and Symmetrical. Accessory muscles - No use of accessory muscles in  breathing. Palpation Palpation of the chest reveals - Non-tender. Auscultation Breath sounds - Normal and Clear.  Cardiovascular Auscultation Rhythm - Regular. Murmurs & Other Heart Sounds - Auscultation of the heart reveals - No Murmurs and No Systolic Clicks.  Abdomen Inspection Inspection of the abdomen reveals - No Visible peristalsis and No Abnormal pulsations. Umbilicus - No Bleeding, No Urine drainage. Palpation/Percussion Palpation and Percussion of the abdomen reveal - Soft, Non Tender, No Rebound tenderness, No Rigidity (guarding)  and No Cutaneous hyperesthesia. Note: Abdomen soft. Nontender. Not distended. Small umbilical hernia at the base of the stalk about a centimeter. Moderate diastases recti. No guarding.  Male Genitourinary Sexual Maturity Tanner 5 - Adult hair pattern and Adult penile size and shape. Note: Bilateral inguinal hernias to the groins. Right greater than left. Sensitive but reducible. Testes and epididymides phallus otherwise within normal limits  Peripheral Vascular Upper Extremity Inspection - Left - No Cyanotic nailbeds - Left, Not Ischemic. Inspection - Right - No Cyanotic nailbeds - Right, Not Ischemic.  Neurologic Neurologic evaluation reveals -normal attention span and ability to concentrate, able to name objects and repeat phrases. Appropriate fund of knowledge , normal sensation and normal coordination. Mental Status Affect - not angry, not paranoid. Cranial Nerves-Normal Bilaterally. Gait-Normal.  Neuropsychiatric Mental status exam performed with findings of-able to articulate well with normal speech/language, rate, volume and coherence, thought content normal with ability to perform basic computations and apply abstract reasoning and no evidence of hallucinations, delusions, obsessions or homicidal/suicidal ideation.  Musculoskeletal Global Assessment Spine, Ribs and Pelvis - no instability, subluxation or laxity. Right  Upper Extremity - no instability, subluxation or laxity.  Lymphatic Head & Neck  General Head & Neck Lymphatics: Bilateral - Description - No Localized lymphadenopathy. Axillary  General Axillary Region: Bilateral - Description - No Localized lymphadenopathy. Femoral & Inguinal  Generalized Femoral & Inguinal Lymphatics: Left - Description - No Localized lymphadenopathy. Right - Description - No Localized lymphadenopathy.    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 Moderately sensitive but reducible.  I think he would benefit from hernia repair. Usually do outpatient surgery. Laparoscopic approach with underlay preperitoneal mesh. Risk of needing to do open incision higher given his history of prior radiation for prostate cancer. He wishes to be aggressive and fix them since he has very active and independent and is affecting his quality of life. We will work to coordinate a convenient time.   UMBILICAL HERNIA WITHOUT OBSTRUCTION AND WITHOUT GANGRENE (K42.9)   PREOP - ING HERNIA - ENCOUNTER FOR PREOPERATIVE EXAMINATION FOR GENERAL SURGICAL PROCEDURE (Z01.818)  Current Plans You are being scheduled for surgery- Our schedulers will call you.  You should hear from our office's scheduling department within 5 working days about the location, date, and time of surgery. We try to make accommodations for patient's preferences in scheduling surgery, but sometimes the OR schedule or the surgeon's schedule prevents [place] from making those accommodations.  If you have not heard from our office [phone]) in 5 working days, call the office and ask for your surgeon's nurse.  If you have other questions about your diagnosis, plan, or surgery, call the office and ask for your surgeon's nurse.  Written instructions  provided The anatomy & physiology of the abdominal wall and pelvic floor was discussed. The pathophysiology of hernias in the inguinal and pelvic region was discussed. Natural history risks such as progressive enlargement, pain, incarceration, and strangulation was discussed. Contributors to complications such as smoking, obesity, diabetes, prior surgery, etc were discussed.  I feel the risks of no intervention will lead to serious problems that outweigh the operative risks; therefore, I recommended surgery to reduce and repair the hernia. I explained laparoscopic techniques with possible need for an open approach. I noted usual use of mesh to patch and/or buttress hernia repair  Risks such as bleeding,  infection, abscess, need for further treatment, heart attack, death, and other risks were discussed. I noted a good likelihood this will help address the problem. Goals of post-operative recovery were discussed as well. Possibility that this will not correct all symptoms was explained. I stressed the importance of low-impact activity, aggressive pain control, avoiding constipation, & not pushing through pain to minimize risk of post-operative chronic pain or injury. Possibility of reherniation was discussed. We will work to minimize complications.  An educational handout further explaining the pathology & treatment options was given as well. Questions were answered. The patient expresses understanding & wishes to proceed with surgery.  Pt Education - Pamphlet Given - Laparoscopic Hernia Repair: discussed with patient and provided information. Pt Education - [facility] Pain Control (Malayah Demuro) Pt Education - [facility] Hernia Post-Op HCI (Jessie Schrieber): discussed with patient and provided information. Pt Education - [facility] Mesh education: discussed with patient and provided information.  PROSTATE CA (C61) Impression: Prostate cancer status post radiotherapy treatment 08/01/19-09/07/19. No evidence of any recurrence  or progression  He claims his nocturia is quite minimal at this point to the point that he is not having taken his Flomax anymore. 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 Stable to slightly smaller in size anterior follow-up. I'm assuming that Dr. Diona Fanti feels this is not malignancy & is a complex cyst. Defer to him.  Terry Hector, MD, FACS, MASCRS  Gastrointestinal and Minimally Invasive Surgery  [facility] [address], [place], [place] [address] [phone] Fax [phone] Main/Paging  CONTACT INFORMATION: Weekday (9AM-5PM) concerns: Call [facility] main office at [phone] Weeknight (5PM-9AM) or Weekend/Holiday concerns: Check [email or web address] for General Surgery [facility] coverage (Please, do not use SecureChat as it is not reliable communication to operating surgeons for immediate patient care)

## 2020-09-11 DIAGNOSIS — E1122 Type 2 diabetes mellitus with diabetic chronic kidney disease: Secondary | ICD-10-CM | POA: Diagnosis not present

## 2020-09-11 DIAGNOSIS — N1832 Chronic kidney disease, stage 3b: Secondary | ICD-10-CM | POA: Diagnosis not present

## 2020-09-11 DIAGNOSIS — I129 Hypertensive chronic kidney disease with stage 1 through stage 4 chronic kidney disease, or unspecified chronic kidney disease: Secondary | ICD-10-CM | POA: Diagnosis not present

## 2020-09-11 DIAGNOSIS — E7800 Pure hypercholesterolemia, unspecified: Secondary | ICD-10-CM | POA: Diagnosis not present

## 2020-09-24 DIAGNOSIS — L821 Other seborrheic keratosis: Secondary | ICD-10-CM | POA: Diagnosis not present

## 2020-09-24 DIAGNOSIS — L814 Other melanin hyperpigmentation: Secondary | ICD-10-CM | POA: Diagnosis not present

## 2020-09-24 DIAGNOSIS — D2272 Melanocytic nevi of left lower limb, including hip: Secondary | ICD-10-CM | POA: Diagnosis not present

## 2020-09-24 DIAGNOSIS — L570 Actinic keratosis: Secondary | ICD-10-CM | POA: Diagnosis not present

## 2020-09-24 DIAGNOSIS — D225 Melanocytic nevi of trunk: Secondary | ICD-10-CM | POA: Diagnosis not present

## 2020-09-24 DIAGNOSIS — L578 Other skin changes due to chronic exposure to nonionizing radiation: Secondary | ICD-10-CM | POA: Diagnosis not present

## 2020-09-24 DIAGNOSIS — L820 Inflamed seborrheic keratosis: Secondary | ICD-10-CM | POA: Diagnosis not present

## 2020-10-21 NOTE — Patient Instructions (Addendum)
DUE TO COVID-19 ONLY ONE VISITOR IS ALLOWED TO COME WITH YOU AND STAY IN THE WAITING ROOM ONLY DURING PRE OP AND PROCEDURE DAY OF SURGERY. THE 1 VISITOR  MAY VISIT WITH YOU AFTER SURGERY IN YOUR PRIVATE ROOM DURING VISITING HOURS ONLY!                 Rosebud Poles    Your procedure is scheduled on: 10/30/20   Report to [facility] Main  Entrance   Report to admitting at  6:30 AM     Call this number if you have problems the morning of surgery [phone]    . BRUSH YOUR TEETH MORNING OF SURGERY AND RINSE YOUR MOUTH OUT, NO CHEWING GUM CANDY OR MINTS.   No food after midnight.    You may have clear liquid until 5:30 AM.    At 5:00 AM drink pre surgery drink.   Nothing by mouth after 5:30 AM.   Take these medicines the morning of surgery with A SIP OF WATER: none  DO NOT TAKE ANY DIABETIC MEDICATIONS DAY OF YOUR SURGERY                               You may not have any metal on your body including               piercings  Do not wear jewelry,  lotions, powders or deodorant              Men may shave face and neck.   Do not bring valuables to the hospital. [place].  Contacts, dentures or bridgework may not be worn into surgery.      Patients discharged the day of surgery will not be allowed to drive home.   IF YOU ARE HAVING SURGERY AND GOING HOME THE SAME DAY, YOU MUST HAVE AN ADULT TO DRIVE YOU HOME AND BE WITH YOU FOR 24 HOURS.  YOU MAY GO HOME BY TAXI OR UBER OR ORTHERWISE, BUT AN ADULT MUST ACCOMPANY YOU HOME AND STAY WITH YOU FOR 24 HOURS.  Name and phone number of your driver:  Special Instructions: N/A              Please read over the following fact sheets you were given: _____________________________________________________________________             [facility] - Preparing for Surgery Before surgery, you can play an important role.  Because skin is not sterile, your skin needs to be as free of  germs as possible.  You can reduce the number of germs on your skin by washing with CHG (chlorahexidine gluconate) soap before surgery.  CHG is an antiseptic cleaner which kills germs and bonds with the skin to continue killing germs even after washing. Please DO NOT use if you have an allergy to CHG or antibacterial soaps.  If your skin becomes reddened/irritated stop using the CHG and inform your nurse when you arrive at Short Stay. .  You may shave your face/neck.  Please follow these instructions carefully:  1.  Shower with CHG Soap the night before surgery and the  morning of Surgery.  2.  If you choose to wash your hair, wash your hair first as usual with your  normal  shampoo.  3.  After you shampoo, rinse  your hair and body thoroughly to remove the  shampoo.                                        4.  Use CHG as you would any other liquid soap.  You can apply chg directly  to the skin and wash                       Gently with a scrungie or clean washcloth.  5.  Apply the CHG Soap to your body ONLY FROM THE NECK DOWN.   Do not use on face/ open                           Wound or open sores. Avoid contact with eyes, ears mouth and genitals (private parts).                       Wash face,  Genitals (private parts) with your normal soap.             6.  Wash thoroughly, paying special attention to the area where your surgery  will be performed.  7.  Thoroughly rinse your body with warm water from the neck down.  8.  DO NOT shower/wash with your normal soap after using and rinsing off  the CHG Soap.             9.  Pat yourself dry with a clean towel.            10.  Wear clean pajamas.            11.  Place clean sheets on your bed the night of your first shower and do not  sleep with pets. Day of Surgery : Do not apply any lotions/deodorants the morning of surgery.  Please wear clean clothes to the hospital/surgery center.  FAILURE TO FOLLOW THESE INSTRUCTIONS MAY RESULT IN THE  CANCELLATION OF YOUR SURGERY PATIENT SIGNATURE_________________________________  NURSE SIGNATURE__________________________________  ________________________________________________________________________

## 2020-10-22 ENCOUNTER — Encounter (HOSPITAL_COMMUNITY)
Admission: RE | Admit: 2020-10-22 | Discharge: 2020-10-22 | Disposition: A | Discharge status: Home / Self Care | Payer: Medicare Other | Source: Ambulatory Visit | Attending: Surgery | Admitting: Surgery

## 2020-10-22 ENCOUNTER — Other Ambulatory Visit: Payer: Self-pay

## 2020-10-22 ENCOUNTER — Encounter (HOSPITAL_COMMUNITY): Payer: Self-pay

## 2020-10-22 DIAGNOSIS — Z01818 Encounter for other preprocedural examination: Secondary | ICD-10-CM | POA: Insufficient documentation

## 2020-10-22 HISTORY — DX: Unspecified osteoarthritis, unspecified site: M19.90

## 2020-10-22 LAB — BASIC METABOLIC PANEL
Anion gap: 6 (ref 5–15)
BUN: 20 mg/dL (ref 8–23)
CO2: 26 mmol/L (ref 22–32)
Calcium: 9.5 mg/dL (ref 8.9–10.3)
Chloride: 109 mmol/L (ref 98–111)
Creatinine, Ser: 1.44 mg/dL — ABNORMAL HIGH (ref 0.61–1.24)
GFR, Estimated: 49 mL/min — ABNORMAL LOW (ref >60)
Glucose, Bld: 150 mg/dL — ABNORMAL HIGH (ref 70–99)
Potassium: 4.8 mmol/L (ref 3.5–5.1)
Sodium: 141 mmol/L (ref 135–145)

## 2020-10-22 LAB — CBC
HCT: 43 % (ref 39.0–52.0)
Hemoglobin: 13.7 g/dL (ref 13.0–17.0)
MCH: 28.8 pg (ref 26.0–34.0)
MCHC: 31.9 g/dL (ref 30.0–36.0)
MCV: 90.3 fL (ref 80.0–100.0)
Platelets: 186 10*3/uL (ref 150–400)
RBC: 4.76 MIL/uL (ref 4.22–5.81)
RDW: 12.9 % (ref 11.5–15.5)
WBC: 5.2 10*3/uL (ref 4.0–10.5)
nRBC: 0 % (ref 0.0–0.2)

## 2020-10-22 LAB — GLUCOSE, CAPILLARY: Glucose-Capillary: 133 mg/dL — ABNORMAL HIGH (ref 70–99)

## 2020-10-22 LAB — HEMOGLOBIN A1C
Hgb A1c MFr Bld: 7.4 % — ABNORMAL HIGH (ref 4.8–5.6)
Mean Plasma Glucose: 165.68 mg/dL

## 2020-10-22 NOTE — Progress Notes (Signed)
COVID Vaccine Completed:Yes Date COVID Vaccine completed:08/04/19-Booster 05/01/20 COVID vaccine manufacturer:   Moderna     PCP - Dr. Particia Nearing Cardiologist - none  Chest x-ray - no EKG - 10/22/20-chart, Epic Stress Test - no ECHO - no Cardiac Cath - no Pacemaker/ICD device last checked:NA  Sleep Study - no CPAP -   Fasting Blood Sugar - 138-147 Checks Blood Sugar _QD____ times a day  Blood Thinner Instructions:NA Aspirin Instructions: Last Dose:  Anesthesia review:   Patient denies shortness of breath, fever, cough and chest pain at PAT appointment Yes. Pt reports no SOB with any activities.  Patient verbalized understanding of instructions that were given to them at the PAT appointment. Patient was also instructed that they will need to review over the PAT instructions again at home before surgery.Yes

## 2020-10-28 ENCOUNTER — Other Ambulatory Visit (HOSPITAL_COMMUNITY): Payer: Medicare Other

## 2020-10-29 MED ORDER — BUPIVACAINE LIPOSOME 1.3 % IJ SUSP
20.0000 mL | Freq: Once | INTRAMUSCULAR | Status: DC
  Filled 2020-10-29: qty 20

## 2020-10-29 NOTE — Anesthesia Preprocedure Evaluation (Addendum)
Anesthesia Evaluation  Patient identified by MRN, date of birth, ID band Patient awake    Reviewed: Allergy & Precautions, NPO status , Patient's Chart, lab work & pertinent test results  Airway Mallampati: II  TM Distance: >3 FB Neck ROM: Full    Dental  (+) Teeth Intact   Pulmonary neg pulmonary ROS,    Pulmonary exam normal        Cardiovascular hypertension, Pt. on medications  Rhythm:Regular Rate:Normal     Neuro/Psych negative neurological ROS  negative psych ROS   GI/Hepatic Neg liver ROS, Bilateral inguinal hernias   Endo/Other  diabetes, Oral Hypoglycemic Agents  Renal/GU negative Renal ROS  negative genitourinary   Musculoskeletal  (+) Arthritis ,   Abdominal (+)  Abdomen: soft. Bowel sounds: normal.  Peds  Hematology negative hematology ROS (+)   Anesthesia Other Findings   Reproductive/Obstetrics                            Anesthesia Physical Anesthesia Plan  ASA: II  Anesthesia Plan: General   Post-op Pain Management:    Induction: Intravenous  PONV Risk Score and Plan: 2 and Ondansetron, Dexamethasone and Treatment may vary due to age or medical condition  Airway Management Planned: Mask and Oral ETT  Additional Equipment: None  Intra-op Plan:   Post-operative Plan: Extubation in OR  Informed Consent: I have reviewed the patients History and Physical, chart, labs and discussed the procedure including the risks, benefits and alternatives for the proposed anesthesia with the patient or authorized representative who has indicated his/her understanding and acceptance.     Dental advisory given  Plan Discussed with: CRNA  Anesthesia Plan Comments: (Lab Results      Component                Value               Date                      WBC                      5.2                 10/22/2020                HGB                      13.7                 10/22/2020                HCT                      43.0                10/22/2020                MCV                      90.3                10/22/2020                PLT                      186  10/22/2020           Lab Results      Component                Value               Date                      NA                       141                 10/22/2020                K                        4.8                 10/22/2020                CO2                      26                  10/22/2020                GLUCOSE                  150 (H)             10/22/2020                BUN                      20                  10/22/2020                CREATININE               1.44 (H)            10/22/2020                CALCIUM                  9.5                 10/22/2020                GFRNONAA                 49 (L)              10/22/2020          )       Anesthesia Quick Evaluation

## 2020-10-30 ENCOUNTER — Ambulatory Visit (HOSPITAL_COMMUNITY)
Admission: RE | Admit: 2020-10-30 | Discharge: 2020-10-30 | Disposition: A | Discharge status: Home / Self Care | Payer: Medicare Other | Attending: Surgery | Admitting: Surgery

## 2020-10-30 ENCOUNTER — Ambulatory Visit (HOSPITAL_COMMUNITY): Payer: Medicare Other | Admitting: Certified Registered"

## 2020-10-30 ENCOUNTER — Encounter (HOSPITAL_COMMUNITY)
Admission: RE | Disposition: A | Discharge status: Home / Self Care | Payer: Self-pay | Source: Home / Self Care | Attending: Surgery

## 2020-10-30 ENCOUNTER — Encounter (HOSPITAL_COMMUNITY): Payer: Self-pay | Admitting: Surgery

## 2020-10-30 DIAGNOSIS — K429 Umbilical hernia without obstruction or gangrene: Secondary | ICD-10-CM | POA: Diagnosis not present

## 2020-10-30 DIAGNOSIS — E7800 Pure hypercholesterolemia, unspecified: Secondary | ICD-10-CM

## 2020-10-30 DIAGNOSIS — D235 Other benign neoplasm of skin of trunk: Secondary | ICD-10-CM | POA: Diagnosis not present

## 2020-10-30 DIAGNOSIS — K4000 Bilateral inguinal hernia, with obstruction, without gangrene, not specified as recurrent: Secondary | ICD-10-CM | POA: Diagnosis not present

## 2020-10-30 DIAGNOSIS — D1801 Hemangioma of skin and subcutaneous tissue: Secondary | ICD-10-CM | POA: Insufficient documentation

## 2020-10-30 DIAGNOSIS — D225 Melanocytic nevi of trunk: Secondary | ICD-10-CM | POA: Insufficient documentation

## 2020-10-30 DIAGNOSIS — L821 Other seborrheic keratosis: Secondary | ICD-10-CM | POA: Diagnosis not present

## 2020-10-30 DIAGNOSIS — D2370 Other benign neoplasm of skin of unspecified lower limb, including hip: Secondary | ICD-10-CM | POA: Insufficient documentation

## 2020-10-30 DIAGNOSIS — K458 Other specified abdominal hernia without obstruction or gangrene: Secondary | ICD-10-CM | POA: Insufficient documentation

## 2020-10-30 DIAGNOSIS — D485 Neoplasm of uncertain behavior of skin: Secondary | ICD-10-CM | POA: Insufficient documentation

## 2020-10-30 DIAGNOSIS — L570 Actinic keratosis: Secondary | ICD-10-CM | POA: Insufficient documentation

## 2020-10-30 DIAGNOSIS — K4020 Bilateral inguinal hernia, without obstruction or gangrene, not specified as recurrent: Secondary | ICD-10-CM | POA: Diagnosis not present

## 2020-10-30 DIAGNOSIS — N2889 Other specified disorders of kidney and ureter: Secondary | ICD-10-CM | POA: Insufficient documentation

## 2020-10-30 DIAGNOSIS — L814 Other melanin hyperpigmentation: Secondary | ICD-10-CM | POA: Insufficient documentation

## 2020-10-30 DIAGNOSIS — M1990 Unspecified osteoarthritis, unspecified site: Secondary | ICD-10-CM | POA: Diagnosis present

## 2020-10-30 DIAGNOSIS — R7303 Prediabetes: Secondary | ICD-10-CM | POA: Insufficient documentation

## 2020-10-30 DIAGNOSIS — D176 Benign lipomatous neoplasm of spermatic cord: Secondary | ICD-10-CM | POA: Insufficient documentation

## 2020-10-30 DIAGNOSIS — Z7984 Long term (current) use of oral hypoglycemic drugs: Secondary | ICD-10-CM | POA: Diagnosis not present

## 2020-10-30 DIAGNOSIS — L578 Other skin changes due to chronic exposure to nonionizing radiation: Secondary | ICD-10-CM | POA: Insufficient documentation

## 2020-10-30 DIAGNOSIS — Z79899 Other long term (current) drug therapy: Secondary | ICD-10-CM | POA: Insufficient documentation

## 2020-10-30 DIAGNOSIS — B079 Viral wart, unspecified: Secondary | ICD-10-CM | POA: Insufficient documentation

## 2020-10-30 DIAGNOSIS — Z8546 Personal history of malignant neoplasm of prostate: Secondary | ICD-10-CM | POA: Insufficient documentation

## 2020-10-30 DIAGNOSIS — C61 Malignant neoplasm of prostate: Secondary | ICD-10-CM | POA: Diagnosis not present

## 2020-10-30 DIAGNOSIS — I10 Essential (primary) hypertension: Secondary | ICD-10-CM | POA: Diagnosis present

## 2020-10-30 DIAGNOSIS — R351 Nocturia: Secondary | ICD-10-CM | POA: Diagnosis not present

## 2020-10-30 DIAGNOSIS — Z923 Personal history of irradiation: Secondary | ICD-10-CM | POA: Diagnosis not present

## 2020-10-30 DIAGNOSIS — E119 Type 2 diabetes mellitus without complications: Secondary | ICD-10-CM | POA: Diagnosis not present

## 2020-10-30 HISTORY — PX: INGUINAL HERNIA REPAIR: SHX194

## 2020-10-30 LAB — GLUCOSE, CAPILLARY
Glucose-Capillary: 157 mg/dL — ABNORMAL HIGH (ref 70–99)
Glucose-Capillary: 212 mg/dL — ABNORMAL HIGH (ref 70–99)

## 2020-10-30 SURGERY — REPAIR, HERNIA, INGUINAL, LAPAROSCOPIC
Anesthesia: General | Laterality: Bilateral

## 2020-10-30 MED ORDER — BUPIVACAINE-EPINEPHRINE 0.25% -1:200000 IJ SOLN
INTRAMUSCULAR | Status: AC
  Filled 2020-10-30: qty 1

## 2020-10-30 MED ORDER — TRAMADOL HCL 50 MG PO TABS: 50.0000 mg | ORAL_TABLET | Freq: Four times a day (QID) | ORAL | Status: DC | PRN

## 2020-10-30 MED ORDER — ACETAMINOPHEN 650 MG RE SUPP
650.0000 mg | Freq: Four times a day (QID) | RECTAL | Status: DC | PRN
  Filled 2020-10-30: qty 1

## 2020-10-30 MED ORDER — ACETAMINOPHEN 325 MG PO TABS
325.0000 mg | ORAL_TABLET | Freq: Four times a day (QID) | ORAL | Status: DC | PRN
Start: 2020-10-30 — End: 2020-10-30

## 2020-10-30 MED ORDER — CHLORHEXIDINE GLUCONATE CLOTH 2 % EX PADS: 6.0000 | MEDICATED_PAD | Freq: Once | CUTANEOUS | Status: DC

## 2020-10-30 MED ORDER — LACTATED RINGERS IV SOLN: INTRAVENOUS | Status: DC

## 2020-10-30 MED ORDER — CHLORHEXIDINE GLUCONATE 0.12 % MT SOLN
15.0000 mL | Freq: Once | OROMUCOSAL | Status: AC
  Administered 2020-10-30: 15 mL via OROMUCOSAL

## 2020-10-30 MED ORDER — BUPIVACAINE-EPINEPHRINE 0.25% -1:200000 IJ SOLN
INTRAMUSCULAR | Status: DC | PRN
  Administered 2020-10-30: 50 mL

## 2020-10-30 MED ORDER — CEFAZOLIN SODIUM-DEXTROSE 2-4 GM/100ML-% IV SOLN
2.0000 g | INTRAVENOUS | Status: AC
  Administered 2020-10-30: 2 g via INTRAVENOUS
  Filled 2020-10-30: qty 100

## 2020-10-30 MED ORDER — PROMETHAZINE HCL 25 MG/ML IJ SOLN: 6.2500 mg | INTRAMUSCULAR | Status: DC | PRN

## 2020-10-30 MED ORDER — LIDOCAINE 2% (20 MG/ML) 5 ML SYRINGE
INTRAMUSCULAR | Status: DC | PRN
  Administered 2020-10-30: 40 mg via INTRAVENOUS

## 2020-10-30 MED ORDER — ACETAMINOPHEN 10 MG/ML IV SOLN
INTRAVENOUS | Status: AC
  Filled 2020-10-30: qty 100

## 2020-10-30 MED ORDER — DEXAMETHASONE SODIUM PHOSPHATE 10 MG/ML IJ SOLN
INTRAMUSCULAR | Status: AC
  Filled 2020-10-30: qty 1

## 2020-10-30 MED ORDER — FENTANYL CITRATE (PF) 100 MCG/2ML IJ SOLN
INTRAMUSCULAR | Status: AC
  Filled 2020-10-30: qty 2

## 2020-10-30 MED ORDER — LIDOCAINE 2% (20 MG/ML) 5 ML SYRINGE
INTRAMUSCULAR | Status: AC
  Filled 2020-10-30: qty 5

## 2020-10-30 MED ORDER — PROPOFOL 10 MG/ML IV BOLUS
INTRAVENOUS | Status: AC
  Filled 2020-10-30: qty 40

## 2020-10-30 MED ORDER — TRAMADOL HCL 50 MG PO TABS
50.0000 mg | ORAL_TABLET | Freq: Once | ORAL | Status: AC
  Administered 2020-10-30: 50 mg via ORAL

## 2020-10-30 MED ORDER — ONDANSETRON HCL 4 MG/2ML IJ SOLN
INTRAMUSCULAR | Status: AC
  Filled 2020-10-30: qty 2

## 2020-10-30 MED ORDER — DEXAMETHASONE SODIUM PHOSPHATE 10 MG/ML IJ SOLN
INTRAMUSCULAR | Status: DC | PRN
  Administered 2020-10-30: 4 mg via INTRAVENOUS

## 2020-10-30 MED ORDER — ROCURONIUM BROMIDE 10 MG/ML (PF) SYRINGE
PREFILLED_SYRINGE | INTRAVENOUS | Status: DC | PRN
  Administered 2020-10-30: 60 mg via INTRAVENOUS
  Administered 2020-10-30: 10 mg via INTRAVENOUS

## 2020-10-30 MED ORDER — LIDOCAINE 20MG/ML (2%) 15 ML SYRINGE OPTIME
INTRAMUSCULAR | Status: DC | PRN
  Administered 2020-10-30: 1.5 mg/kg/h via INTRAVENOUS

## 2020-10-30 MED ORDER — ONDANSETRON HCL 4 MG/2ML IJ SOLN
INTRAMUSCULAR | Status: DC | PRN
  Administered 2020-10-30: 4 mg via INTRAVENOUS

## 2020-10-30 MED ORDER — ROCURONIUM BROMIDE 10 MG/ML (PF) SYRINGE
PREFILLED_SYRINGE | INTRAVENOUS | Status: AC
  Filled 2020-10-30: qty 10

## 2020-10-30 MED ORDER — ENSURE PRE-SURGERY PO LIQD
296.0000 mL | Freq: Once | ORAL | Status: DC
  Filled 2020-10-30: qty 296

## 2020-10-30 MED ORDER — EPHEDRINE SULFATE-NACL 50-0.9 MG/10ML-% IV SOSY
PREFILLED_SYRINGE | INTRAVENOUS | Status: DC | PRN
  Administered 2020-10-30 (×4): 5 mg via INTRAVENOUS

## 2020-10-30 MED ORDER — FENTANYL CITRATE (PF) 250 MCG/5ML IJ SOLN
INTRAMUSCULAR | Status: DC | PRN
  Administered 2020-10-30 (×3): 50 ug via INTRAVENOUS

## 2020-10-30 MED ORDER — TRAMADOL HCL 50 MG PO TABS
ORAL_TABLET | ORAL | Status: AC
  Filled 2020-10-30: qty 1

## 2020-10-30 MED ORDER — PHENYLEPHRINE HCL (PRESSORS) 10 MG/ML IV SOLN
INTRAVENOUS | Status: AC
  Filled 2020-10-30: qty 1

## 2020-10-30 MED ORDER — PROPOFOL 10 MG/ML IV BOLUS
INTRAVENOUS | Status: DC | PRN
  Administered 2020-10-30: 180 mg via INTRAVENOUS

## 2020-10-30 MED ORDER — ACETAMINOPHEN 10 MG/ML IV SOLN: 1000.0000 mg | Freq: Once | INTRAVENOUS | Status: DC | PRN

## 2020-10-30 MED ORDER — GABAPENTIN 300 MG PO CAPS
300.0000 mg | ORAL_CAPSULE | ORAL | Status: AC
  Administered 2020-10-30: 300 mg via ORAL
  Filled 2020-10-30: qty 1

## 2020-10-30 MED ORDER — ORAL CARE MOUTH RINSE: 15.0000 mL | Freq: Once | OROMUCOSAL | Status: AC

## 2020-10-30 MED ORDER — 0.9 % SODIUM CHLORIDE (POUR BTL) OPTIME
TOPICAL | Status: DC | PRN
  Administered 2020-10-30: 1000 mL

## 2020-10-30 MED ORDER — ACETAMINOPHEN 500 MG PO TABS
1000.0000 mg | ORAL_TABLET | ORAL | Status: AC
  Administered 2020-10-30: 1000 mg via ORAL
  Filled 2020-10-30: qty 2

## 2020-10-30 MED ORDER — TRAMADOL HCL 50 MG PO TABS: 50.0000 mg | ORAL_TABLET | Freq: Four times a day (QID) | ORAL | 0 refills | Status: AC | PRN

## 2020-10-30 MED ORDER — DIAZEPAM 5 MG PO TABS: 5.0000 mg | ORAL_TABLET | Freq: Four times a day (QID) | ORAL | 1 refills | Status: AC | PRN

## 2020-10-30 MED ORDER — FENTANYL CITRATE (PF) 100 MCG/2ML IJ SOLN
25.0000 ug | INTRAMUSCULAR | Status: DC | PRN
  Administered 2020-10-30: 25 ug via INTRAVENOUS
  Administered 2020-10-30: 50 ug via INTRAVENOUS

## 2020-10-30 MED ORDER — SUGAMMADEX SODIUM 200 MG/2ML IV SOLN
INTRAVENOUS | Status: DC | PRN
  Administered 2020-10-30: 160 mg via INTRAVENOUS

## 2020-10-30 MED ORDER — LIDOCAINE HCL 2 % IJ SOLN
INTRAMUSCULAR | Status: AC
  Filled 2020-10-30: qty 20

## 2020-10-30 MED ORDER — BUPIVACAINE LIPOSOME 1.3 % IJ SUSP
INTRAMUSCULAR | Status: DC | PRN
  Administered 2020-10-30: 20 mL

## 2020-10-30 MED ORDER — LACTATED RINGERS IR SOLN
Status: DC | PRN
  Administered 2020-10-30: 1000 mL

## 2020-10-30 SURGICAL SUPPLY — 38 items
CABLE HIGH FREQUENCY MONO STRZ (ELECTRODE) ×2 IMPLANT
CHLORAPREP W/TINT 26 (MISCELLANEOUS) ×2 IMPLANT
COVER SURGICAL LIGHT HANDLE (MISCELLANEOUS) ×2 IMPLANT
COVER WAND RF STERILE (DRAPES) IMPLANT
DECANTER SPIKE VIAL GLASS SM (MISCELLANEOUS) ×2 IMPLANT
DEVICE SECURE STRAP 25 ABSORB (INSTRUMENTS) IMPLANT
DRAPE WARM FLUID 44X44 (DRAPES) ×2 IMPLANT
DRSG TEGADERM 2-3/8X2-3/4 SM (GAUZE/BANDAGES/DRESSINGS) ×2 IMPLANT
DRSG TEGADERM 4X4.75 (GAUZE/BANDAGES/DRESSINGS) ×2 IMPLANT
ELECT REM PT RETURN 15FT ADLT (MISCELLANEOUS) ×2 IMPLANT
GAUZE SPONGE 2X2 8PLY STRL LF (GAUZE/BANDAGES/DRESSINGS) ×1 IMPLANT
GLOVE SURG LTX SZ8 (GLOVE) ×2 IMPLANT
GLOVE SURG UNDER LTX SZ8 (GLOVE) ×2 IMPLANT
GOWN STRL REUS W/TWL XL LVL3 (GOWN DISPOSABLE) ×4 IMPLANT
IRRIG SUCT STRYKERFLOW 2 WTIP (MISCELLANEOUS)
IRRIGATION SUCT STRKRFLW 2 WTP (MISCELLANEOUS) IMPLANT
KIT BASIN OR (CUSTOM PROCEDURE TRAY) ×2 IMPLANT
KIT TURNOVER KIT A (KITS) ×2 IMPLANT
MESH HERNIA 6X6 BARD (Mesh General) ×3 IMPLANT
MESH HERNIA BARD 6X6 (Mesh General) ×3 IMPLANT
NEEDLE INSUFFLATION 14GA 120MM (NEEDLE) IMPLANT
PAD POSITIONING PINK XL (MISCELLANEOUS) ×2 IMPLANT
SCISSORS LAP 5X35 DISP (ENDOMECHANICALS) ×2 IMPLANT
SET TUBE SMOKE EVAC HIGH FLOW (TUBING) ×2 IMPLANT
SLEEVE ADV FIXATION 5X100MM (TROCAR) ×2 IMPLANT
SPONGE GAUZE 2X2 8PLY STRL LF (GAUZE/BANDAGES/DRESSINGS) ×2 IMPLANT
SPONGE GAUZE 2X2 STER 10/PKG (GAUZE/BANDAGES/DRESSINGS) ×1
SUT MNCRL AB 4-0 PS2 18 (SUTURE) ×2 IMPLANT
SUT PDS AB 1 CT1 27 (SUTURE) ×4 IMPLANT
SUT VIC AB 2-0 SH 27 (SUTURE)
SUT VIC AB 2-0 SH 27X BRD (SUTURE) IMPLANT
SUT VICRYL 0 UR6 27IN ABS (SUTURE) ×2 IMPLANT
TACKER 5MM HERNIA 3.5CML NAB (ENDOMECHANICALS) IMPLANT
TOWEL OR 17X26 10 PK STRL BLUE (TOWEL DISPOSABLE) ×2 IMPLANT
TOWEL OR NON WOVEN STRL DISP B (DISPOSABLE) ×2 IMPLANT
TRAY LAPAROSCOPIC (CUSTOM PROCEDURE TRAY) ×2 IMPLANT
TROCAR ADV FIXATION 5X100MM (TROCAR) ×2 IMPLANT
TROCAR XCEL BLUNT TIP 100MML (ENDOMECHANICALS) ×2 IMPLANT

## 2020-10-30 NOTE — Transfer of Care (Signed)
Immediate Anesthesia Transfer of Care Note  Patient: Terry [name]  Procedure(s) Performed: LAPAROSCOPIC BILATERAL INGUINAL HERNIA, BILATERAL OBTURATOR HERNIA  AND UMBILICAL REPAIRS WITH MESH (Bilateral )   Patient Location: PACU  Anesthesia Type:General  Level of Consciousness: awake, drowsy and patient cooperative  Airway & Oxygen Therapy: Patient Spontanous Breathing and Patient connected to face mask oxygen  Post-op Assessment: Report given to RN and Post -op Vital signs reviewed and stable  Post vital signs: Reviewed and stable  Last Vitals:  Vitals Value Taken Time  BP 151/69 10/30/20 1035  Temp    Pulse 94 10/30/20 1037  Resp 18 10/30/20 1037  SpO2 100 % 10/30/20 1037  Vitals shown include unvalidated device data.  Last Pain:  Vitals:   10/30/20 0651  TempSrc: Oral         Complications: No complications documented.

## 2020-10-30 NOTE — Op Note (Signed)
.10/30/2020  10:44 AM  PATIENT:  Terry [name]  80 y.o. male  Patient Care Team: Lajean Manes, MD as PCP - General (Internal Medicine) Franchot Gallo, MD as Consulting Physician (Urology) Tyler Pita, MD as Consulting Physician (Radiation Oncology) Michael Boston, MD as Consulting Physician (General Surgery)  PRE-OPERATIVE DIAGNOSIS:  BILATERAL INGUINAL HERNIAS  POST-OPERATIVE DIAGNOSIS:   BILATERAL INGUINAL HERNIAS BILATERAL OBTURATOR HERNIAS   PROCEDURE:   LAPAROSCOPIC BILATERAL INGUINAL HERNIA REPAIRS LAPAROSCOPIC BILATERAL OBTURATOR HERNIA  REPAIRS PRIMARY UMBILICAL REPAIR EXCISION OF UMBILICAL MOLE TRANSVERSUS ABDOMINIS PLANE (TAP) BLOCK - BILATERAL  SURGEON:  Adin Hector, MD  ASSISTANT: None  ANESTHESIA:     Regional ilioinguinal and genitofemoral and spermatic cord nerve blocks  General  Nerve block provided with liposomal bupivacaine (Experel) mixed with 0.25% bupivacaine as a Bilateral TAP block x 71mL each side at the level of the transverse abdominis & preperitoneal spaces along the flank at the anterior axillary line, from subcostal ridge to iliac crest under laparoscopic guidance    EBL:  Total I/O In: 1318 [I.V.:1218; IV Piggyback:100] Out: 30 [Blood:30].  See anesthesia record  Delay start of Pharmacological VTE agent (>24hrs) due to surgical blood loss or risk of bleeding:  no  DRAINS: NONE  SPECIMEN:  NONE  DISPOSITION OF SPECIMEN:  N/A  COUNTS:  YES  PLAN OF CARE: Discharge to home after PACU  PATIENT DISPOSITION:  PACU - hemodynamically stable.  INDICATION:  Patient with bilateral inguinal hernias.  I recommended surgery:  The anatomy & physiology of the abdominal wall and pelvic floor was discussed.  The pathophysiology of hernias in the inguinal and pelvic region was discussed.  Natural history risks such as progressive enlargement, pain, incarceration & strangulation was discussed.   Contributors to complications such as  smoking, obesity, diabetes, prior surgery, etc were discussed.    I feel the risks of no intervention will lead to serious problems that outweigh the operative risks; therefore, I recommended surgery to reduce and repair the hernia.  I explained laparoscopic techniques with possible need for an open approach.  I noted usual use of mesh to patch and/or buttress hernia repair  Risks such as bleeding, infection, abscess, need for further treatment, heart attack, death, and other risks were discussed.  I noted a good likelihood this will help address the problem.   Goals of post-operative recovery were discussed as well.  Possibility that this will not correct all symptoms was explained.  I stressed the importance of low-impact activity, aggressive pain control, avoiding constipation, & not pushing through pain to minimize risk of post-operative chronic pain or injury. Possibility of reherniation was discussed.  We will work to minimize complications.     An educational handout further explaining the pathology & treatment options was given as well.  Questions were answered.  The patient expresses understanding & wishes to proceed with surgery.  OR FINDINGS: Large right direct inguinal hernia with obturator hernia.  R  DESCRIPTION:  The patient was identified & brought into the operating room. The patient was positioned supine with arms tucked. SCDs were active during the entire case. The patient underwent general anesthesia without any difficulty.  The abdomen was prepped and draped in a sterile fashion. The patient's bladder was emptied.  A Surgical Timeout confirmed our plan.  I made a transverse incision through the inferior umbilical fold.  I made a small transverse nick through the anterior rectus fascia contralateral to the inguinal hernia side and placed a 0-vicryl stitch through the  fascia.  I placed a Hasson trocar into the preperitoneal plane.  Entry was clean.  We induced carbon dioxide  insufflation. Camera inspection revealed no injury.  I used a 68mm angled scope to bluntly free the peritoneum off the infraumbilical anterior abdominal wall.  I created enough of a preperitoneal pocket to place 59mm ports into the right & left mid-abdomen into this preperitoneal cavity.  I focused attention on the RIGHT pelvis since that was the dominant hernia side.   I used blunt & focused sharp dissection to free the peritoneum off the flank and down to the pubic rim.  I freed the anteriolateral bladder wall off the anteriolateral pelvic wall, sparing midline attachments.   I located a swath of peritoneum going into a hernia fascial defect at the  direct space consistent with  a direct space inguinal hernia..  I gradually freed the peritoneal hernia sac off safely and reduced it into the preperitoneal space.  I freed the peritoneum off the spermatic vessels & vas deferens.  I freed peritoneum off the retroperitoneum along the psoas muscle.  Obturator hernias noted as well.  Spermatic cord lipoma was dissected away & removed.  I checked & assured hemostasis.     I turned attention on the opposite  LEFT pelvis.  I did dissection in a similar, mirror-image fashion. The patient had a direct space inguinal hernia..   Obturator hernia as well.   Spermatic cord lipoma was dissected away & removed.    I checked & assured hemostasis.     I chose 15x15 cm sheets of standard weight mesh (Bard Marlex), one for each side.  I cut a single sigmoid-shaped slit ~6cm from a corner of each mesh.  I placed the meshes into the preperitoneal space & laid them as overlapping diamonds such that at the inferior points, a 6x6 cm corner flap rested in the true anterolateral pelvis, covering the obturator & femoral foramina.   I allowed the bladder to return to the pubis, this helping tuck the corners of the mesh in the anteriolateral pelvis.  The medial corners overlapped each other across midline cephalad to the pubic rim.   Given  the numerous hernias of moderate size, I placed a third 15x15cm mesh in the center as a vertical diamond.  The lateral wings of the mesh overlap across the direct spaces and internal rings where the dominant hernias were.  This provided good coverage and reinforcement of the hernia repairs.  Because of the central mesh placement with good overlap, I did not place any tacks.   I held the hernia sacs cephalad & evacuated carbon dioxide.  I freed the umbilicus off of the fascia to expose the umbilical hernia.  I primary repair that with #1 PDS.  Patient had an atypical multicolored mole within the umbilical skin so I excised it.  He had enough redundant skin so I could recreate an umbilical dimpling.  I closed the fascia with absorbable suture.  I closed the skin using 4-0 monocryl stitch.  Sterile dressings were applied.   The patient was extubated & arrived in the PACU in stable condition..  I had discussed postoperative care with the patient in the holding area.  Instructions are written in the chart.  I discussed operative findings, updated the patient's status, discussed probable steps to recovery, and gave postoperative recommendations to the patient's spouse.  Recommendations were made.  Questions were answered.  She expressed understanding & appreciation.   Adin Hector, M.D., F.A.C.S.  Gastrointestinal and Minimally Invasive Surgery [facility] [address], [place], [place] [address] [phone] Main / Paging  10/30/2020 10:44 AM

## 2020-10-30 NOTE — H&P (Signed)
Terry [name] DOB: Oct 08, 1940   Patient Care Team: Lajean Manes, MD as PCP - General (Internal Medicine) Franchot Gallo, MD as Consulting Physician (Urology) Tyler Pita, MD as Consulting Physician (Radiation Oncology) Michael Boston, MD as Consulting Physician (General Surgery)  ` ` Patient sent for surgical consultation at the request of Dr Diona Fanti, [facility]  Chief Complaint: Groin hernias. ` ` The patient is a pleasant elderly active male. History of prostate cancer treated with radiation therapy by Dr. Tyler Pita last year. Had Gleason score of 7. His to have some nocturia but has noted that that is actually improved to the point that he is not even taking his Flomax anymore. Noted some groin swelling and discomfort the past few years. Right-sided getting larger and more bothersome. Discuss with his urologist. Inguinal hernias suspected on exam and CT scan. Surgical consultation requested. Patient can walk half hour without difficulty. Sexually active. No history of heart attack or stroke. He does have borderline diabetes that is well controlled with diet and daily metformin. No sleep apnea. He does not smoke. No history of any abdominal or hernia or groin surgery. Usually moves his bowels every day. He's found to have a 4 cm mass on the right kidney that is stable on follow-up CT scan 2 years later. Followed by urology. Patient denies any history of urinary tract infection. He is not immunosuppressed or HIV positive. Has not had any prior hernia surgery.  (Review of systems as stated in this history (HPI) or in the review of systems. Otherwise all other 12 point ROS are negative) ` ` ###########################################`  This patient encounter took 25 minutes today to perform the following: obtain history, perform exam, review outside records, interpret tests & imaging, counsel the patient on their diagnosis; and, document  this encounter, including findings & plan in the electronic health record (EHR).   Past Surgical History Terry Forehand, CNA; 09/03/2020 9:16 AM) Cataract Surgery Bilateral. Colon Polyp Removal - Colonoscopy Tonsillectomy Vasectomy  Diagnostic Studies History Terry Forehand, CNA; 09/03/2020 9:16 AM) Colonoscopy 1-5 years ago  Allergies Terry Forehand, CNA; 09/03/2020 9:16 AM) No Known Drug Allergies [09/03/2020]: Allergies Reconciled  Medication History Terry Forehand, CNA; 09/03/2020 9:16 AM) Atorvastatin Calcium (10MG  Tablet, Oral) Active. Contour Next Test (In Vitro) Active. Finasteride (5MG  Tablet, Oral) Active. Lisinopril (20MG  Tablet, Oral) Active. metFORMIN HCl (500MG  Tablet, Oral) Active. Tamsulosin HCl (0.4MG  Capsule, Oral) Active. Medications Reconciled  Social History Terry Forehand, CNA; 09/03/2020 9:16 AM) Alcohol use Occasional alcohol use. Caffeine use Carbonated beverages, Coffee, Tea. No drug use Tobacco use Never smoker.  Family History Terry Forehand, CNA; 09/03/2020 9:16 AM) Arthritis Father, Sister. Breast Cancer Sister. Cancer Father. Cerebrovascular Accident Mother. Diabetes Mellitus Father. Heart Disease Father. Hypertension Daughter, Father, Mother.  Other Problems Terry Forehand, CNA; 09/03/2020 9:16 AM) Diabetes Mellitus Enlarged Prostate Gastroesophageal Reflux Disease High blood pressure Hypercholesterolemia Inguinal Hernia Prostate Cancer     Review of Systems Terry Forehand CNA; 09/03/2020 9:16 AM) General Present- Fatigue. Not Present- Appetite Loss, Chills, Fever, Night Sweats, Weight Gain and Weight Loss. Skin Not Present- Change in Wart/Mole, Dryness, Hives, Jaundice, New Lesions, Non-Healing Wounds, Rash and Ulcer. HEENT Present- Wears glasses/contact lenses. Not Present- Earache, Hearing Loss, Hoarseness, Nose Bleed, Oral Ulcers, Ringing in the Ears, Seasonal Allergies,  Sinus Pain, Sore Throat, Visual Disturbances and Yellow Eyes. Respiratory Not Present- Bloody sputum, Chronic Cough, Difficulty Breathing, Snoring and Wheezing. Breast Not Present- Breast Mass, Breast Pain, Nipple Discharge and Skin Changes. Cardiovascular Present- Leg Cramps and  Swelling of Extremities. Not Present- Chest Pain, Difficulty Breathing Lying Down, Palpitations, Rapid Heart Rate and Shortness of Breath. Gastrointestinal Not Present- Abdominal Pain, Bloating, Bloody Stool, Change in Bowel Habits, Chronic diarrhea, Constipation, Difficulty Swallowing, Excessive gas, Gets full quickly at meals, Hemorrhoids, Indigestion, Nausea, Rectal Pain and Vomiting. Male Genitourinary Present- Urgency. Not Present- Blood in Urine, Change in Urinary Stream, Frequency, Impotence, Nocturia, Painful Urination and Urine Leakage. Musculoskeletal Not Present- Back Pain, Joint Pain, Joint Stiffness, Muscle Pain, Muscle Weakness and Swelling of Extremities. Neurological Present- Numbness. Not Present- Decreased Memory, Fainting, Headaches, Seizures, Tingling, Tremor, Trouble walking and Weakness. Psychiatric Not Present- Anxiety, Bipolar, Change in Sleep Pattern, Depression, Fearful and Frequent crying. Endocrine Not Present- Cold Intolerance, Excessive Hunger, Hair Changes, Heat Intolerance, Hot flashes and New Diabetes. Hematology Not Present- Blood Thinners, Easy Bruising, Excessive bleeding, Gland problems, HIV and Persistent Infections.  Vitals Adriana Reams Alston CNA; 09/03/2020 9:17 AM) 09/03/2020 9:16 AM Weight: 172.13 lb Height: 72in Body Surface Area: 2 m Body Mass Index: 23.34 kg/m  Temp.: 98.57F  Pulse: 91 (Regular)  P.OX: 96% (Room air) BP: 130/80(Sitting, Left Arm, Standard)        Physical Exam Adin Hector MD; 09/03/2020 10:01 AM)  General Mental Status-Alert. General Appearance-Not in acute distress, Not Sickly. Orientation-Oriented X3. Hydration-Well  hydrated. Voice-Normal.  Integumentary Global Assessment Upon inspection and palpation of skin surfaces of the - Axillae: non-tender, no inflammation or ulceration, no drainage. and Distribution of scalp and body hair is normal. General Characteristics Temperature - normal warmth is noted.  Head and Neck Head-normocephalic, atraumatic with no lesions or palpable masses. Face Global Assessment - atraumatic, no absence of expression. Neck Global Assessment - no abnormal movements, no bruit auscultated on the right, no bruit auscultated on the left, no decreased range of motion, non-tender. Trachea-midline. Thyroid Gland Characteristics - non-tender.  Eye Eyeball - Left-Extraocular movements intact, No Nystagmus - Left. Eyeball - Right-Extraocular movements intact, No Nystagmus - Right. Cornea - Left-No Hazy - Left. Cornea - Right-No Hazy - Right. Sclera/Conjunctiva - Left-No scleral icterus, No Discharge - Left. Sclera/Conjunctiva - Right-No scleral icterus, No Discharge - Right. Pupil - Left-Direct reaction to light normal. Pupil - Right-Direct reaction to light normal.  ENMT Ears Pinna - Left - no drainage observed, no generalized tenderness observed. Pinna - Right - no drainage observed, no generalized tenderness observed. Nose and Sinuses External Inspection of the Nose - no destructive lesion observed. Inspection of the nares - Left - quiet respiration. Inspection of the nares - Right - quiet respiration. Mouth and Throat Lips - Upper Lip - no fissures observed, no pallor noted. Lower Lip - no fissures observed, no pallor noted. Nasopharynx - no discharge present. Oral Cavity/Oropharynx - Tongue - no dryness observed. Oral Mucosa - no cyanosis observed. Hypopharynx - no evidence of airway distress observed.  Chest and Lung Exam Inspection Movements - Normal and Symmetrical. Accessory muscles - No use of accessory muscles in  breathing. Palpation Palpation of the chest reveals - Non-tender. Auscultation Breath sounds - Normal and Clear.  Cardiovascular Auscultation Rhythm - Regular. Murmurs & Other Heart Sounds - Auscultation of the heart reveals - No Murmurs and No Systolic Clicks.  Abdomen Inspection Inspection of the abdomen reveals - No Visible peristalsis and No Abnormal pulsations. Umbilicus - No Bleeding, No Urine drainage. Palpation/Percussion Palpation and Percussion of the abdomen reveal - Soft, Non Tender, No Rebound tenderness, No Rigidity (guarding) and No Cutaneous hyperesthesia. Note: Abdomen soft. Nontender. Not distended. Small umbilical  hernia at the base of the stalk about a centimeter. Moderate diastases recti. No guarding.  Male Genitourinary Sexual Maturity Tanner 5 - Adult hair pattern and Adult penile size and shape. Note: Bilateral inguinal hernias to the groins. Right greater than left. Sensitive but reducible. Testes and epididymides phallus otherwise within normal limits  Peripheral Vascular Upper Extremity Inspection - Left - No Cyanotic nailbeds - Left, Not Ischemic. Inspection - Right - No Cyanotic nailbeds - Right, Not Ischemic.  Neurologic Neurologic evaluation reveals -normal attention span and ability to concentrate, able to name objects and repeat phrases. Appropriate fund of knowledge , normal sensation and normal coordination. Mental Status Affect - not angry, not paranoid. Cranial Nerves-Normal Bilaterally. Gait-Normal.  Neuropsychiatric Mental status exam performed with findings of-able to articulate well with normal speech/language, rate, volume and coherence, thought content normal with ability to perform basic computations and apply abstract reasoning and no evidence of hallucinations, delusions, obsessions or homicidal/suicidal ideation.  Musculoskeletal Global Assessment Spine, Ribs and Pelvis - no instability, subluxation or laxity.  Right Upper Extremity - no instability, subluxation or laxity.  Lymphatic Head & Neck  General Head & Neck Lymphatics: Bilateral - Description - No Localized lymphadenopathy. Axillary  General Axillary Region: Bilateral - Description - No Localized lymphadenopathy. Femoral & Inguinal  Generalized Femoral & Inguinal Lymphatics: Left - Description - No Localized lymphadenopathy. Right - Description - No Localized lymphadenopathy.    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 Moderately sensitive but reducible.  I think he would benefit from hernia repair. Usually do outpatient surgery. Laparoscopic approach with underlay preperitoneal mesh. Risk of needing to do open incision higher given his history of prior radiation for prostate cancer. He wishes to be aggressive and fix them since he has very active and independent and is affecting his quality of life. We will work to coordinate a convenient time.   UMBILICAL HERNIA WITHOUT OBSTRUCTION AND WITHOUT GANGRENE (K42.9)   PREOP - ING HERNIA - ENCOUNTER FOR PREOPERATIVE EXAMINATION FOR GENERAL SURGICAL PROCEDURE (Z01.818)  The anatomy & physiology of the abdominal wall and pelvic floor was discussed. The pathophysiology of hernias in the inguinal and pelvic region was discussed. Natural history risks such as progressive enlargement, pain, incarceration, and strangulation was discussed. Contributors to complications such as smoking, obesity, diabetes, prior surgery, etc were discussed.  I feel the risks of no intervention will lead to serious problems that outweigh the operative risks; therefore, I recommended surgery to reduce and repair the hernia. I explained laparoscopic techniques with possible need for an open approach. I noted usual use of mesh to  patch and/or buttress hernia repair  Risks such as bleeding, infection, abscess, need for further treatment, heart attack, death, and other risks were discussed. I noted a good likelihood this will help address the problem. Goals of post-operative recovery were discussed as well. Possibility that this will not correct all symptoms was explained. I stressed the importance of low-impact activity, aggressive pain control, avoiding constipation, & not pushing through pain to minimize risk of post-operative chronic pain or injury. Possibility of reherniation was discussed. We will work to minimize complications.  An educational handout further explaining the pathology & treatment options was given as well. Questions were answered. The patient expresses understanding & wishes to proceed with surgery.  Pt Education - Pamphlet Given - Laparoscopic Hernia Repair: discussed with  patient and provided information. Pt Education - CCS Pain Control (Turrell Severt) Pt Education - [facility] Hernia Post-Op HCI (Rebeka Kimble): discussed with patient and provided information. Pt Education - [facility] Mesh education: discussed with patient and provided information.  PROSTATE CA (C61) Impression: Prostate cancer status post radiotherapy treatment 08/01/19-09/07/19. No evidence of any recurrence or progression  He claims his nocturia is quite minimal at this point to the point that he is not having taken his Flomax anymore. 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 Stable to slightly smaller in size anterior follow-up. I'm assuming that Dr. Diona Fanti feels this is not malignancy & is a complex cyst. Defer  to him.  Adin Hector, MD, FACS, MASCRS  Gastrointestinal and Minimally Invasive Surgery  [facility] [address] [place], [place] [address] [phone] Fax [phone] Main/Paging

## 2020-10-30 NOTE — Anesthesia Procedure Notes (Signed)
Procedure Name: Intubation Date/Time: 10/30/2020 8:30 AM Performed by: Eben Burow, CRNA Pre-anesthesia Checklist: Patient identified, Emergency Drugs available, Suction available, Patient being monitored and Timeout performed Patient Re-evaluated:Patient Re-evaluated prior to induction Oxygen Delivery Method: Circle system utilized Preoxygenation: Pre-oxygenation with 100% oxygen Induction Type: IV induction Ventilation: Mask ventilation without difficulty Laryngoscope Size: Mac and 4 Grade View: Grade I Tube type: Oral Tube size: 7.5 mm Number of attempts: 1 Airway Equipment and Method: Stylet Placement Confirmation: ETT inserted through vocal cords under direct vision,  positive ETCO2 and breath sounds checked- equal and bilateral Secured at: 23 cm Tube secured with: Tape Dental Injury: Teeth and Oropharynx as per pre-operative assessment

## 2020-10-30 NOTE — Anesthesia Postprocedure Evaluation (Signed)
Anesthesia Post Note  Patient: Rosebud Poles  Procedure(s) Performed: LAPAROSCOPIC BILATERAL INGUINAL HERNIA, BILATERAL OBTURATOR HERNIA  AND UMBILICAL REPAIRS WITH MESH (Bilateral )     Patient location during evaluation: PACU Anesthesia Type: General Level of consciousness: awake and alert and oriented Pain management: pain level controlled Vital Signs Assessment: post-procedure vital signs reviewed and stable Respiratory status: spontaneous breathing, nonlabored ventilation and respiratory function stable Cardiovascular status: blood pressure returned to baseline and stable Postop Assessment: no apparent nausea or vomiting Anesthetic complications: no   No complications documented.  Last Vitals:  Vitals:   10/30/20 1130 10/30/20 1145  BP: (!) 141/75 137/84  Pulse: 85 81  Resp: (!) 23 20  Temp:  (!) 36.3 C  SpO2: 94% 95%    Last Pain:  Vitals:   10/30/20 1100  TempSrc:   PainSc: 0-No pain                 Burwell Bethel A.

## 2020-10-30 NOTE — Discharge Instructions (Signed)
HERNIA REPAIR: POST OP INSTRUCTIONS  ######################################################################  EAT Gradually transition to a high fiber diet with a fiber supplement over the next few weeks after discharge.  Start with a pureed / full liquid diet (see below)  WALK Walk an hour a day.  Control your pain to do that.    CONTROL PAIN Control pain so that you can walk, sleep, tolerate sneezing/coughing, and go up/down stairs.  HAVE A BOWEL MOVEMENT DAILY Keep your bowels regular to avoid problems.  OK to try a laxative to override constipation.  OK to use an antidairrheal to slow down diarrhea.  Call if not better after 2 tries  CALL IF YOU HAVE PROBLEMS/CONCERNS Call if you are still struggling despite following these instructions. Call if you have concerns not answered by these instructions  ######################################################################    1. DIET: Follow a light bland diet & liquids the first 24 hours after arrival home, such as soup, liquids, starches, etc.  Be sure to drink plenty of fluids.  Quickly advance to a usual solid diet within a few days.  Avoid fast food or heavy meals as your are more likely to get nauseated or have irregular bowels.  A low-fat, high-fiber diet for the rest of your life is ideal.   2. Take your usually prescribed home medications unless otherwise directed.  3. PAIN CONTROL: a. Pain is best controlled by a usual combination of three different methods TOGETHER: i. Ice/Heat ii. Over the counter pain medication iii. Prescription pain medication b. Most patients will experience some swelling and bruising around the hernia(s) such as the bellybutton, groins, or old incisions.  Ice packs or heating pads (30-60 minutes up to 6 times a day) will help. Use ice for the first few days to help decrease swelling and bruising, then switch to heat to help relax tight/sore spots and speed recovery.  Some people prefer to use ice  alone, heat alone, alternating between ice & heat.  Experiment to what works for you.  Swelling and bruising can take several weeks to resolve.   c. It is helpful to take an over-the-counter pain medication regularly for the first few weeks.  Choose one of the following that works best for you: i. Naproxen (Aleve, etc)  Two 244m tabs twice a day ii. Ibuprofen (Advil, etc) Three 2036mtabs four times a day (every meal & bedtime) iii. Acetaminophen (Tylenol, etc) 325-65016mour times a day (every meal & bedtime) d. A  prescription for pain medication should be given to you upon discharge.  Take your pain medication as prescribed.  i. If you are having problems/concerns with the prescription medicine (does not control pain, nausea, vomiting, rash, itching, etc), please call us [phone] see if we need to switch you to a different pain medicine that will work better for you and/or control your side effect better. ii. If you need a refill on your pain medication, please contact your pharmacy.  They will contact our office to request authorization. Prescriptions will not be filled after 5 pm or on week-ends.  4. Avoid getting constipated.  Between the surgery and the pain medications, it is common to experience some constipation.  Increasing fluid intake and taking a fiber supplement (such as Metamucil, Citrucel, FiberCon, MiraLax, etc) 1-2 times a day regularly will usually help prevent this problem from occurring.  A mild laxative (prune juice, Milk of Magnesia, MiraLax, etc) should be taken according to package directions if there are no bowel movements after 48  hours.    5. Wash / shower every day.  You may shower over the dressings as they are waterproof.    6. Remove your waterproof bandages, skin tapes, and other bandages 3 days after surgery. You may replace a dressing/Band-Aid to cover the incision for comfort if you wish. You may leave the incisions open to air.  You may replace a  dressing/Band-Aid to cover an incision for comfort if you wish.  Continue to shower over incision(s) after the dressing is off.  7. ACTIVITIES as tolerated:   a. You may resume regular (light) daily activities beginning the next day--such as daily self-care, walking, climbing stairs--gradually increasing activities as tolerated.  Control your pain so that you can walk an hour a day.  If you can walk 30 minutes without difficulty, it is safe to try more intense activity such as jogging, treadmill, bicycling, low-impact aerobics, swimming, etc. b. Save the most intensive and strenuous activity for last such as sit-ups, heavy lifting, contact sports, etc  Refrain from any heavy lifting or straining until you are off narcotics for pain control.   c. DO NOT PUSH THROUGH PAIN.  Let pain be your guide: If it hurts to do something, don't do it.  Pain is your body warning you to avoid that activity for another week until the pain goes down. d. You may drive when you are no longer taking prescription pain medication, you can comfortably wear a seatbelt, and you can safely maneuver your car and apply brakes. e. Dennis Bast may have sexual intercourse when it is comfortable.   8. FOLLOW UP in our office a. Please call [facility] at [phone] to set up an appointment to see your surgeon in the office for a follow-up appointment approximately 2-3 weeks after your surgery. b. Make sure that you call for this appointment the day you arrive home to insure a convenient appointment time.  9.  If you have disability of FMLA / Family leave forms, please bring the forms to the office for processing.  (do not give to your surgeon).  WHEN TO CALL us [phone]: 1. Poor pain control 2. Reactions / problems with new medications (rash/itching, nausea, etc)  3. Fever over 101.5 F (38.5 C) 4. Inability to urinate 5. Nausea and/or vomiting 6. Worsening swelling or bruising 7. Continued bleeding from incision. 8. Increased pain,  redness, or drainage from the incision   The clinic staff is available to answer your questions during regular business hours (8:30am-5pm).  Please don't hesitate to call and ask to speak to one of our nurses for clinical concerns.   If you have a medical emergency, go to the nearest emergency room or call 911.  A surgeon from [facility] is always on call at the hospitals in [facility], [place], [place], [place], [place]  [address] ?  [address] [address], [place], [place]   [address] MAIN: [phone] ? TOLL FREE: [phone] ? FAX: [phone] [email or web address]   General Anesthesia, Adult, Care After This sheet gives you information about how to care for yourself after your procedure. Your health care provider may also give you more specific instructions. If you have problems or questions, contact your health care provider. What can I expect after the procedure? After the procedure, the following side effects are common:  Pain or discomfort at the IV site.  Nausea.  Vomiting.  Sore throat.  Trouble concentrating.  Feeling cold or chills.  Feeling weak or tired.  Sleepiness and fatigue.  Soreness and body aches. These side effects can affect parts of the body that were not involved in surgery. Follow these instructions at home: For the time period you were told by your health care provider:  Rest.  Do not participate in activities where you could fall or become injured.  Do not drive or use machinery.  Do not drink alcohol.  Do not take sleeping pills or medicines that cause drowsiness.  Do not make important decisions or sign legal documents.  Do not take care of children on your own.   Eating and drinking  Follow any instructions from your health care provider about eating or drinking restrictions.  When you feel hungry, start by eating small amounts of foods that are soft and easy to digest  (bland), such as toast. Gradually return to your regular diet.  Drink enough fluid to keep your urine pale yellow.  If you vomit, rehydrate by drinking water, juice, or clear broth. General instructions  If you have sleep apnea, surgery and certain medicines can increase your risk for breathing problems. Follow instructions from your health care provider about wearing your sleep device: ? Anytime you are sleeping, including during daytime naps. ? While taking prescription pain medicines, sleeping medicines, or medicines that make you drowsy.  Have a responsible adult stay with you for the time you are told. It is important to have someone help care for you until you are awake and alert.  Return to your normal activities as told by your health care provider. Ask your health care provider what activities are safe for you.  Take over-the-counter and prescription medicines only as told by your health care provider.  If you smoke, do not smoke without supervision.  Keep all follow-up visits as told by your health care provider. This is important. Contact a health care provider if:  You have nausea or vomiting that does not get better with medicine.  You cannot eat or drink without vomiting.  You have pain that does not get better with medicine.  You are unable to pass urine.  You develop a skin rash.  You have a fever.  You have redness around your IV site that gets worse. Get help right away if:  You have difficulty breathing.  You have chest pain.  You have blood in your urine or stool, or you vomit blood. Summary  After the procedure, it is common to have a sore throat or nausea. It is also common to feel tired.  Have a responsible adult stay with you for the time you are told. It is important to have someone help care for you until you are awake and alert.  When you feel hungry, start by eating small amounts of foods that are soft and easy to digest (bland), such as  toast. Gradually return to your regular diet.  Drink enough fluid to keep your urine pale yellow.  Return to your normal activities as told by your health care provider. Ask your health care provider what activities are safe for you. This information is not intended to replace advice given to you by your health care provider. Make sure you discuss any questions you have with your health care provider. Document Revised: 02/15/2020 Document Reviewed: 09/14/2019 Elsevier Patient Education  2021 [employer].

## 2020-10-31 ENCOUNTER — Encounter (HOSPITAL_COMMUNITY): Payer: Self-pay | Admitting: Surgery

## 2020-11-04 DIAGNOSIS — E1122 Type 2 diabetes mellitus with diabetic chronic kidney disease: Secondary | ICD-10-CM | POA: Diagnosis not present

## 2020-11-04 DIAGNOSIS — I129 Hypertensive chronic kidney disease with stage 1 through stage 4 chronic kidney disease, or unspecified chronic kidney disease: Secondary | ICD-10-CM | POA: Diagnosis not present

## 2020-11-04 DIAGNOSIS — N1832 Chronic kidney disease, stage 3b: Secondary | ICD-10-CM | POA: Diagnosis not present

## 2020-11-04 DIAGNOSIS — E7800 Pure hypercholesterolemia, unspecified: Secondary | ICD-10-CM | POA: Diagnosis not present

## 2020-11-04 LAB — SURGICAL PATHOLOGY

## 2020-11-05 DIAGNOSIS — C61 Malignant neoplasm of prostate: Secondary | ICD-10-CM | POA: Diagnosis not present

## 2020-11-13 DIAGNOSIS — N281 Cyst of kidney, acquired: Secondary | ICD-10-CM | POA: Diagnosis not present

## 2020-11-13 DIAGNOSIS — C61 Malignant neoplasm of prostate: Secondary | ICD-10-CM | POA: Diagnosis not present

## 2020-11-21 DIAGNOSIS — I129 Hypertensive chronic kidney disease with stage 1 through stage 4 chronic kidney disease, or unspecified chronic kidney disease: Secondary | ICD-10-CM | POA: Diagnosis not present

## 2020-11-21 DIAGNOSIS — E1122 Type 2 diabetes mellitus with diabetic chronic kidney disease: Secondary | ICD-10-CM | POA: Diagnosis not present

## 2020-11-21 DIAGNOSIS — N1832 Chronic kidney disease, stage 3b: Secondary | ICD-10-CM | POA: Diagnosis not present

## 2020-12-03 DIAGNOSIS — E7800 Pure hypercholesterolemia, unspecified: Secondary | ICD-10-CM | POA: Diagnosis not present

## 2020-12-03 DIAGNOSIS — I129 Hypertensive chronic kidney disease with stage 1 through stage 4 chronic kidney disease, or unspecified chronic kidney disease: Secondary | ICD-10-CM | POA: Diagnosis not present

## 2020-12-03 DIAGNOSIS — N1832 Chronic kidney disease, stage 3b: Secondary | ICD-10-CM | POA: Diagnosis not present

## 2020-12-03 DIAGNOSIS — E1122 Type 2 diabetes mellitus with diabetic chronic kidney disease: Secondary | ICD-10-CM | POA: Diagnosis not present

## 2021-02-06 DIAGNOSIS — E7800 Pure hypercholesterolemia, unspecified: Secondary | ICD-10-CM | POA: Diagnosis not present

## 2021-02-06 DIAGNOSIS — N1832 Chronic kidney disease, stage 3b: Secondary | ICD-10-CM | POA: Diagnosis not present

## 2021-02-06 DIAGNOSIS — E1122 Type 2 diabetes mellitus with diabetic chronic kidney disease: Secondary | ICD-10-CM | POA: Diagnosis not present

## 2021-02-06 DIAGNOSIS — I129 Hypertensive chronic kidney disease with stage 1 through stage 4 chronic kidney disease, or unspecified chronic kidney disease: Secondary | ICD-10-CM | POA: Diagnosis not present

## 2021-02-07 DIAGNOSIS — R361 Hematospermia: Secondary | ICD-10-CM | POA: Diagnosis not present

## 2021-02-07 DIAGNOSIS — C61 Malignant neoplasm of prostate: Secondary | ICD-10-CM | POA: Diagnosis not present

## 2021-02-27 ENCOUNTER — Ambulatory Visit
Admission: RE | Admit: 2021-02-27 | Discharge: 2021-02-27 | Disposition: A | Discharge status: Home / Self Care | Payer: Medicare Other | Source: Ambulatory Visit | Attending: Geriatric Medicine | Admitting: Geriatric Medicine

## 2021-02-27 ENCOUNTER — Other Ambulatory Visit: Payer: Self-pay | Admitting: Geriatric Medicine

## 2021-02-27 DIAGNOSIS — M13171 Monoarthritis, not elsewhere classified, right ankle and foot: Secondary | ICD-10-CM

## 2021-02-27 DIAGNOSIS — M7989 Other specified soft tissue disorders: Secondary | ICD-10-CM | POA: Diagnosis not present

## 2021-02-27 DIAGNOSIS — Z23 Encounter for immunization: Secondary | ICD-10-CM | POA: Diagnosis not present

## 2021-03-11 DIAGNOSIS — Z23 Encounter for immunization: Secondary | ICD-10-CM | POA: Diagnosis not present

## 2021-04-14 DIAGNOSIS — E1122 Type 2 diabetes mellitus with diabetic chronic kidney disease: Secondary | ICD-10-CM | POA: Diagnosis not present

## 2021-04-14 DIAGNOSIS — I129 Hypertensive chronic kidney disease with stage 1 through stage 4 chronic kidney disease, or unspecified chronic kidney disease: Secondary | ICD-10-CM | POA: Diagnosis not present

## 2021-04-14 DIAGNOSIS — N1832 Chronic kidney disease, stage 3b: Secondary | ICD-10-CM | POA: Diagnosis not present

## 2021-04-14 DIAGNOSIS — E7800 Pure hypercholesterolemia, unspecified: Secondary | ICD-10-CM | POA: Diagnosis not present

## 2021-05-14 DIAGNOSIS — C61 Malignant neoplasm of prostate: Secondary | ICD-10-CM | POA: Diagnosis not present

## 2021-05-28 DIAGNOSIS — N401 Enlarged prostate with lower urinary tract symptoms: Secondary | ICD-10-CM | POA: Diagnosis not present

## 2021-05-28 DIAGNOSIS — R361 Hematospermia: Secondary | ICD-10-CM | POA: Diagnosis not present

## 2021-05-28 DIAGNOSIS — C61 Malignant neoplasm of prostate: Secondary | ICD-10-CM | POA: Diagnosis not present

## 2021-05-28 DIAGNOSIS — R351 Nocturia: Secondary | ICD-10-CM | POA: Diagnosis not present

## 2021-05-29 DIAGNOSIS — I700 Atherosclerosis of aorta: Secondary | ICD-10-CM | POA: Diagnosis not present

## 2021-05-29 DIAGNOSIS — E7800 Pure hypercholesterolemia, unspecified: Secondary | ICD-10-CM | POA: Diagnosis not present

## 2021-05-29 DIAGNOSIS — E1122 Type 2 diabetes mellitus with diabetic chronic kidney disease: Secondary | ICD-10-CM | POA: Diagnosis not present

## 2021-05-29 DIAGNOSIS — I129 Hypertensive chronic kidney disease with stage 1 through stage 4 chronic kidney disease, or unspecified chronic kidney disease: Secondary | ICD-10-CM | POA: Diagnosis not present

## 2021-05-29 DIAGNOSIS — N1832 Chronic kidney disease, stage 3b: Secondary | ICD-10-CM | POA: Diagnosis not present

## 2021-05-29 DIAGNOSIS — Z0000 Encounter for general adult medical examination without abnormal findings: Secondary | ICD-10-CM | POA: Diagnosis not present

## 2021-05-29 DIAGNOSIS — Z1389 Encounter for screening for other disorder: Secondary | ICD-10-CM | POA: Diagnosis not present

## 2021-05-29 DIAGNOSIS — Z79899 Other long term (current) drug therapy: Secondary | ICD-10-CM | POA: Diagnosis not present

## 2021-06-25 DIAGNOSIS — H02831 Dermatochalasis of right upper eyelid: Secondary | ICD-10-CM | POA: Diagnosis not present

## 2021-06-25 DIAGNOSIS — H02834 Dermatochalasis of left upper eyelid: Secondary | ICD-10-CM | POA: Diagnosis not present

## 2021-06-25 DIAGNOSIS — H52203 Unspecified astigmatism, bilateral: Secondary | ICD-10-CM | POA: Diagnosis not present

## 2021-06-25 DIAGNOSIS — E119 Type 2 diabetes mellitus without complications: Secondary | ICD-10-CM | POA: Diagnosis not present

## 2021-08-12 DIAGNOSIS — I129 Hypertensive chronic kidney disease with stage 1 through stage 4 chronic kidney disease, or unspecified chronic kidney disease: Secondary | ICD-10-CM | POA: Diagnosis not present

## 2021-08-12 DIAGNOSIS — E7800 Pure hypercholesterolemia, unspecified: Secondary | ICD-10-CM | POA: Diagnosis not present

## 2021-08-12 DIAGNOSIS — N1832 Chronic kidney disease, stage 3b: Secondary | ICD-10-CM | POA: Diagnosis not present

## 2021-08-12 DIAGNOSIS — E1122 Type 2 diabetes mellitus with diabetic chronic kidney disease: Secondary | ICD-10-CM | POA: Diagnosis not present

## 2021-10-06 DIAGNOSIS — D225 Melanocytic nevi of trunk: Secondary | ICD-10-CM | POA: Diagnosis not present

## 2021-10-06 DIAGNOSIS — D485 Neoplasm of uncertain behavior of skin: Secondary | ICD-10-CM | POA: Diagnosis not present

## 2021-10-06 DIAGNOSIS — L570 Actinic keratosis: Secondary | ICD-10-CM | POA: Diagnosis not present

## 2021-10-06 DIAGNOSIS — L821 Other seborrheic keratosis: Secondary | ICD-10-CM | POA: Diagnosis not present

## 2021-10-06 DIAGNOSIS — L578 Other skin changes due to chronic exposure to nonionizing radiation: Secondary | ICD-10-CM | POA: Diagnosis not present

## 2021-10-06 DIAGNOSIS — D2272 Melanocytic nevi of left lower limb, including hip: Secondary | ICD-10-CM | POA: Diagnosis not present

## 2021-10-06 DIAGNOSIS — L814 Other melanin hyperpigmentation: Secondary | ICD-10-CM | POA: Diagnosis not present

## 2021-10-28 DIAGNOSIS — L570 Actinic keratosis: Secondary | ICD-10-CM | POA: Diagnosis not present

## 2021-12-08 DIAGNOSIS — C61 Malignant neoplasm of prostate: Secondary | ICD-10-CM | POA: Diagnosis not present

## 2021-12-08 DIAGNOSIS — R3121 Asymptomatic microscopic hematuria: Secondary | ICD-10-CM | POA: Diagnosis not present

## 2021-12-12 DIAGNOSIS — E1122 Type 2 diabetes mellitus with diabetic chronic kidney disease: Secondary | ICD-10-CM | POA: Diagnosis not present

## 2021-12-12 DIAGNOSIS — N1832 Chronic kidney disease, stage 3b: Secondary | ICD-10-CM | POA: Diagnosis not present

## 2021-12-12 DIAGNOSIS — I129 Hypertensive chronic kidney disease with stage 1 through stage 4 chronic kidney disease, or unspecified chronic kidney disease: Secondary | ICD-10-CM | POA: Diagnosis not present

## 2021-12-12 DIAGNOSIS — I700 Atherosclerosis of aorta: Secondary | ICD-10-CM | POA: Diagnosis not present

## 2021-12-12 DIAGNOSIS — E7800 Pure hypercholesterolemia, unspecified: Secondary | ICD-10-CM | POA: Diagnosis not present

## 2021-12-12 DIAGNOSIS — R0789 Other chest pain: Secondary | ICD-10-CM | POA: Diagnosis not present

## 2021-12-15 DIAGNOSIS — Z8546 Personal history of malignant neoplasm of prostate: Secondary | ICD-10-CM | POA: Diagnosis not present

## 2021-12-15 DIAGNOSIS — R361 Hematospermia: Secondary | ICD-10-CM | POA: Diagnosis not present

## 2021-12-25 DIAGNOSIS — N281 Cyst of kidney, acquired: Secondary | ICD-10-CM | POA: Diagnosis not present

## 2021-12-25 DIAGNOSIS — K5730 Diverticulosis of large intestine without perforation or abscess without bleeding: Secondary | ICD-10-CM | POA: Diagnosis not present

## 2021-12-25 DIAGNOSIS — K8020 Calculus of gallbladder without cholecystitis without obstruction: Secondary | ICD-10-CM | POA: Diagnosis not present

## 2022-01-14 IMAGING — CR DG FOOT COMPLETE 3+V*R*
3 series · 3 of 3 positions shown · non-contrast
Comparison: None.

CLINICAL DATA: Swelling and pain at first MTP joint

EXAM:
RIGHT FOOT COMPLETE - 3+ VIEW

[t foot ap right]
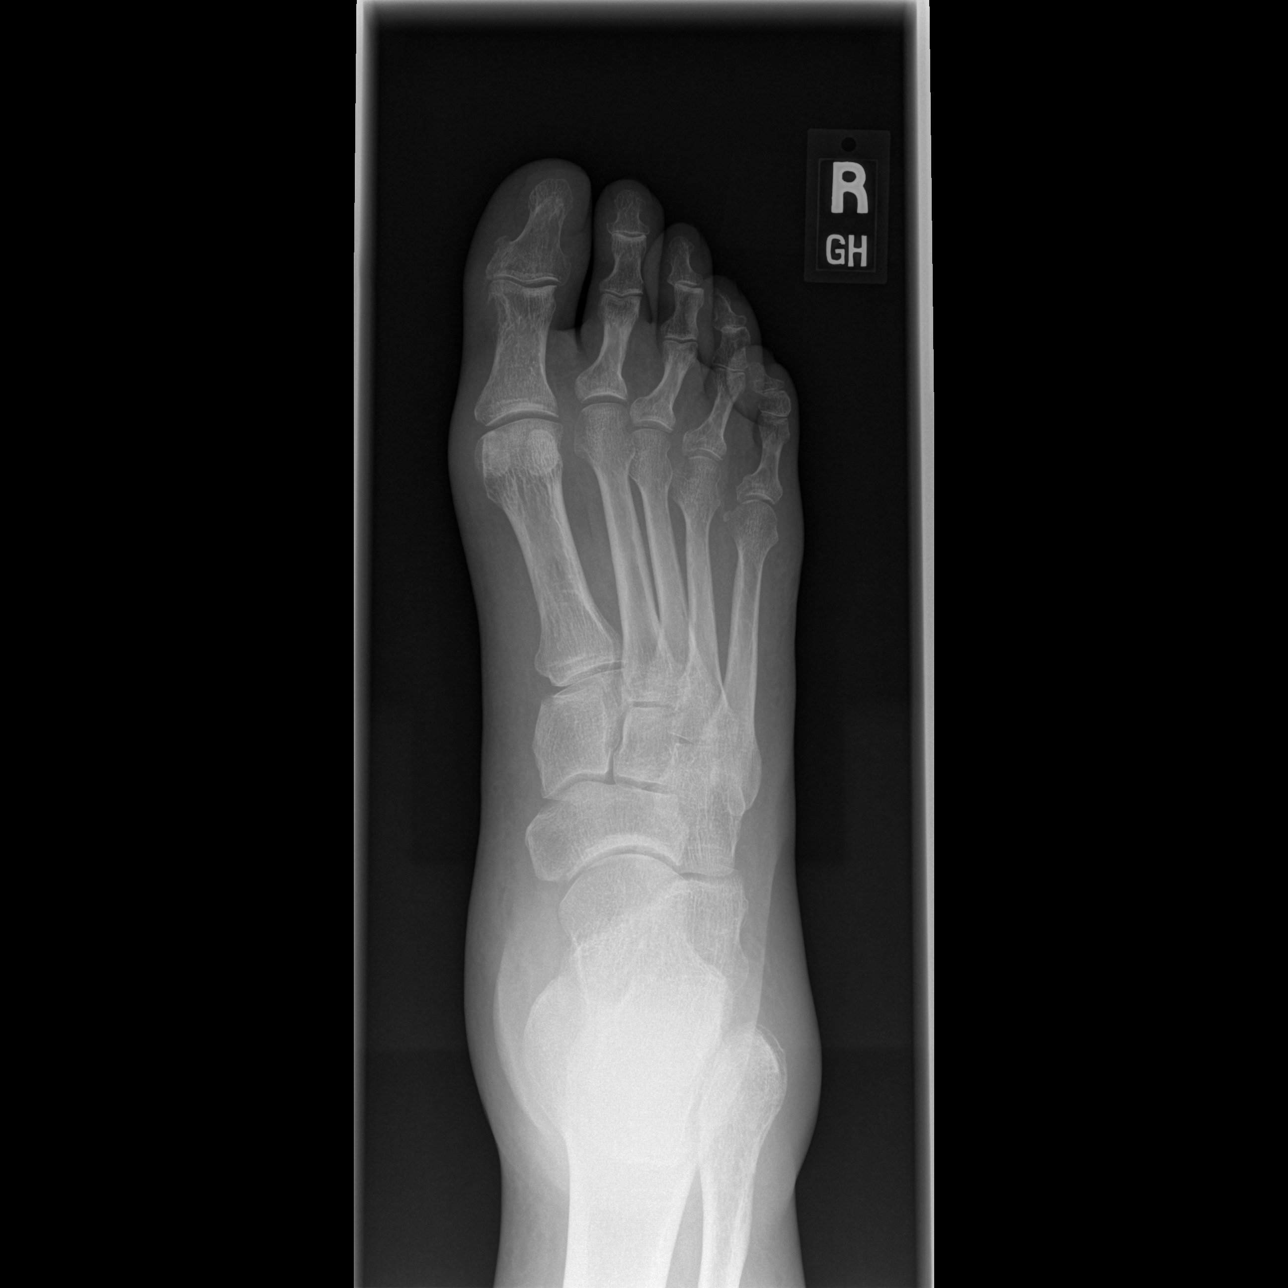

[t foot oblique right]
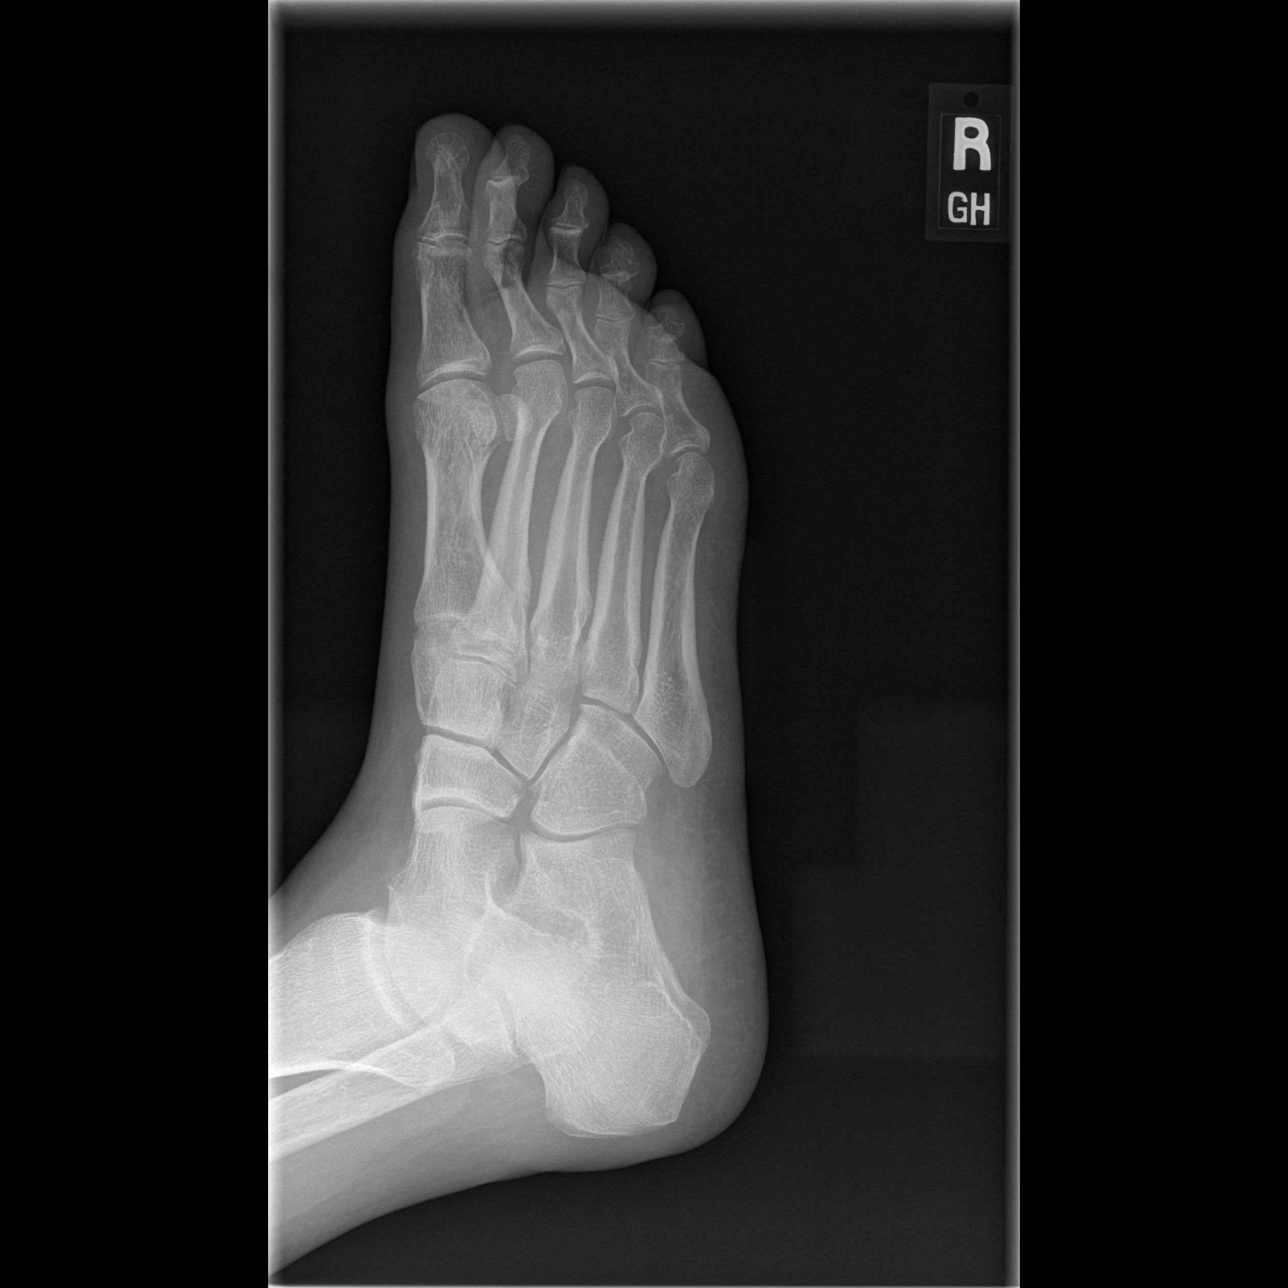

[t foot lat right]
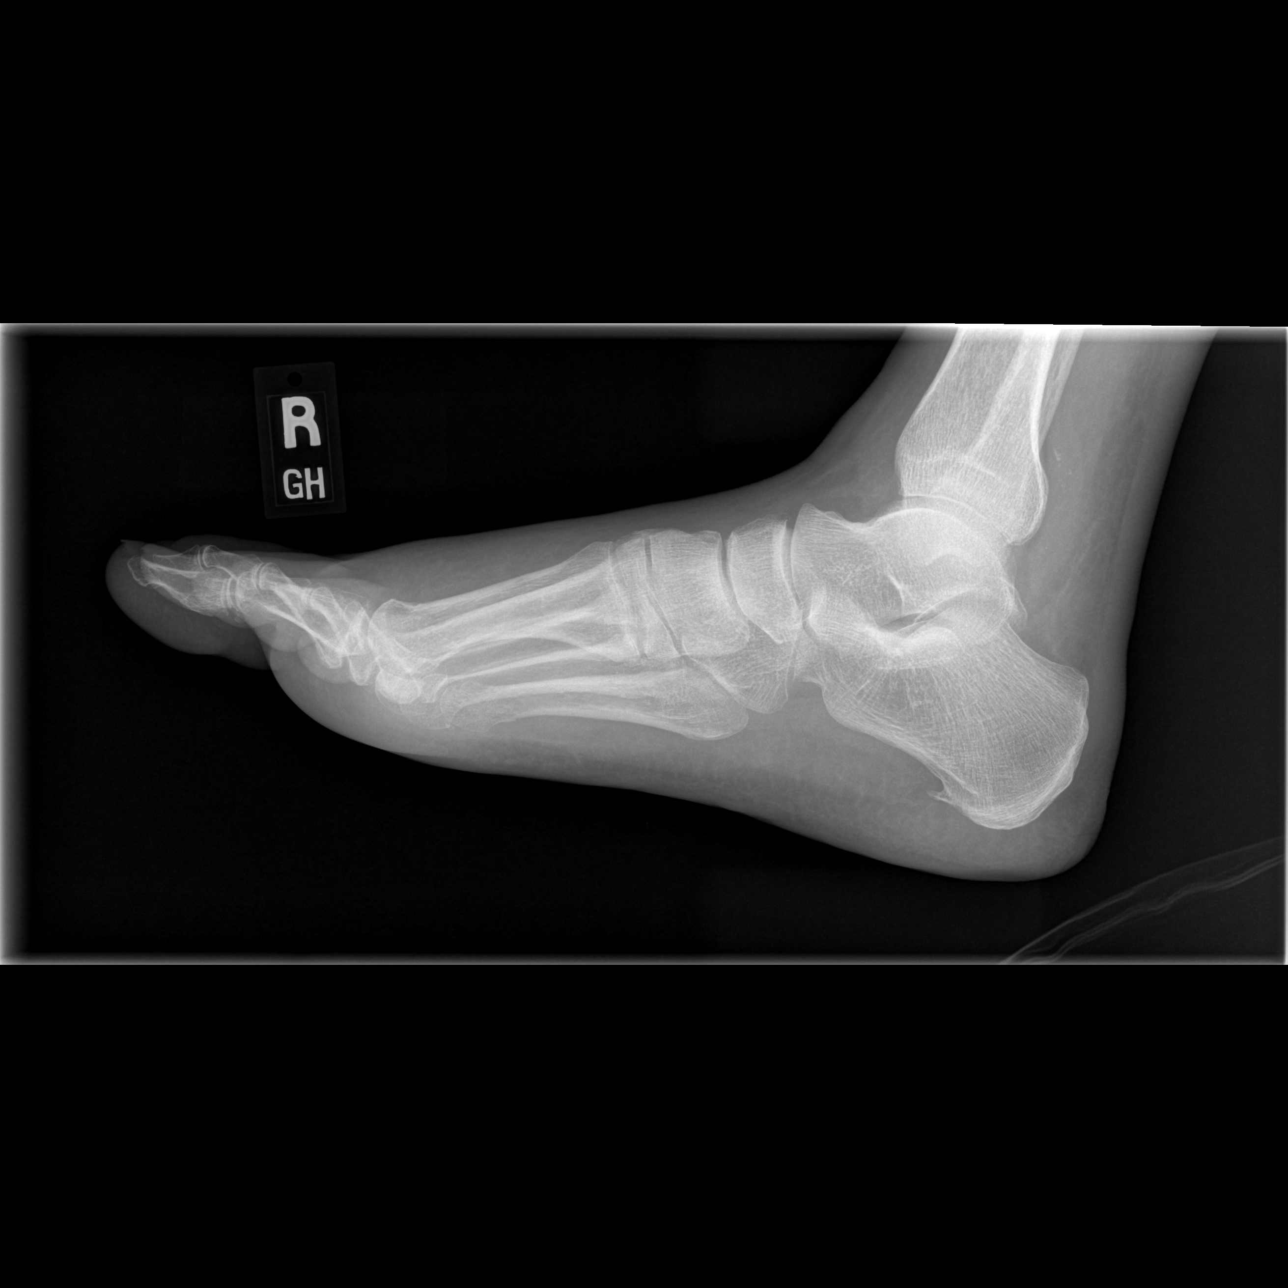

[3 of 3 positions shown; findings below may reference images not displayed]

FINDINGS: No fracture or malalignment. Minimal joint space narrowing at first
MTP joint. Suspected erosions at the head of the first proximal
phalanx and possible small erosion at the lateral base of the first
proximal phalanx. No soft tissue calcifications. Positive for soft
tissue swelling
IMPRESSION: 1. Soft tissue swelling with suspected erosions involving the first
proximal phalanx, findings could be secondary to inflammatory
arthritis or possible [HOSPITAL] disease. If symptoms are suggestive of
infection, correlation with MRI could be obtained for further
evaluation.

## 2022-03-02 DIAGNOSIS — I129 Hypertensive chronic kidney disease with stage 1 through stage 4 chronic kidney disease, or unspecified chronic kidney disease: Secondary | ICD-10-CM | POA: Diagnosis not present

## 2022-03-02 DIAGNOSIS — N1832 Chronic kidney disease, stage 3b: Secondary | ICD-10-CM | POA: Diagnosis not present

## 2022-03-02 DIAGNOSIS — E1122 Type 2 diabetes mellitus with diabetic chronic kidney disease: Secondary | ICD-10-CM | POA: Diagnosis not present

## 2022-03-02 DIAGNOSIS — E7800 Pure hypercholesterolemia, unspecified: Secondary | ICD-10-CM | POA: Diagnosis not present

## 2022-03-14 DIAGNOSIS — Z23 Encounter for immunization: Secondary | ICD-10-CM | POA: Diagnosis not present

## 2022-03-26 DIAGNOSIS — Z23 Encounter for immunization: Secondary | ICD-10-CM | POA: Diagnosis not present

## 2022-06-19 DIAGNOSIS — Z8546 Personal history of malignant neoplasm of prostate: Secondary | ICD-10-CM | POA: Diagnosis not present

## 2022-06-19 DIAGNOSIS — N281 Cyst of kidney, acquired: Secondary | ICD-10-CM | POA: Diagnosis not present

## 2022-06-22 ENCOUNTER — Ambulatory Visit
Admission: RE | Admit: 2022-06-22 | Discharge: 2022-06-22 | Disposition: A | Discharge status: Home / Self Care | Payer: Medicare Other | Source: Ambulatory Visit | Attending: Internal Medicine | Admitting: Internal Medicine

## 2022-06-22 ENCOUNTER — Other Ambulatory Visit: Payer: Self-pay | Admitting: Internal Medicine

## 2022-06-22 DIAGNOSIS — R052 Subacute cough: Secondary | ICD-10-CM

## 2022-06-22 DIAGNOSIS — H9193 Unspecified hearing loss, bilateral: Secondary | ICD-10-CM | POA: Diagnosis not present

## 2022-06-22 DIAGNOSIS — R059 Cough, unspecified: Secondary | ICD-10-CM | POA: Diagnosis not present

## 2022-06-22 DIAGNOSIS — N281 Cyst of kidney, acquired: Secondary | ICD-10-CM | POA: Diagnosis not present

## 2022-06-22 DIAGNOSIS — R609 Edema, unspecified: Secondary | ICD-10-CM | POA: Diagnosis not present

## 2022-06-22 DIAGNOSIS — Z1331 Encounter for screening for depression: Secondary | ICD-10-CM | POA: Diagnosis not present

## 2022-06-22 DIAGNOSIS — E1122 Type 2 diabetes mellitus with diabetic chronic kidney disease: Secondary | ICD-10-CM | POA: Diagnosis not present

## 2022-06-22 DIAGNOSIS — I700 Atherosclerosis of aorta: Secondary | ICD-10-CM | POA: Diagnosis not present

## 2022-06-22 DIAGNOSIS — Z86010 Personal history of colonic polyps: Secondary | ICD-10-CM | POA: Diagnosis not present

## 2022-06-22 DIAGNOSIS — Z23 Encounter for immunization: Secondary | ICD-10-CM | POA: Diagnosis not present

## 2022-06-22 DIAGNOSIS — N1832 Chronic kidney disease, stage 3b: Secondary | ICD-10-CM | POA: Diagnosis not present

## 2022-06-22 DIAGNOSIS — I129 Hypertensive chronic kidney disease with stage 1 through stage 4 chronic kidney disease, or unspecified chronic kidney disease: Secondary | ICD-10-CM | POA: Diagnosis not present

## 2022-06-22 DIAGNOSIS — Z0000 Encounter for general adult medical examination without abnormal findings: Secondary | ICD-10-CM | POA: Diagnosis not present

## 2022-06-22 DIAGNOSIS — R0989 Other specified symptoms and signs involving the circulatory and respiratory systems: Secondary | ICD-10-CM | POA: Diagnosis not present

## 2022-06-22 DIAGNOSIS — E7800 Pure hypercholesterolemia, unspecified: Secondary | ICD-10-CM | POA: Diagnosis not present

## 2022-06-22 DIAGNOSIS — K8020 Calculus of gallbladder without cholecystitis without obstruction: Secondary | ICD-10-CM | POA: Diagnosis not present

## 2022-06-25 DIAGNOSIS — H52203 Unspecified astigmatism, bilateral: Secondary | ICD-10-CM | POA: Diagnosis not present

## 2022-06-25 DIAGNOSIS — E119 Type 2 diabetes mellitus without complications: Secondary | ICD-10-CM | POA: Diagnosis not present

## 2022-06-25 DIAGNOSIS — H353121 Nonexudative age-related macular degeneration, left eye, early dry stage: Secondary | ICD-10-CM | POA: Diagnosis not present

## 2022-08-19 DIAGNOSIS — E7800 Pure hypercholesterolemia, unspecified: Secondary | ICD-10-CM | POA: Diagnosis not present

## 2022-08-19 DIAGNOSIS — Z5181 Encounter for therapeutic drug level monitoring: Secondary | ICD-10-CM | POA: Diagnosis not present

## 2022-11-20 DIAGNOSIS — R351 Nocturia: Secondary | ICD-10-CM | POA: Diagnosis not present

## 2022-11-20 DIAGNOSIS — N281 Cyst of kidney, acquired: Secondary | ICD-10-CM | POA: Diagnosis not present

## 2022-11-20 DIAGNOSIS — N401 Enlarged prostate with lower urinary tract symptoms: Secondary | ICD-10-CM | POA: Diagnosis not present

## 2022-11-30 DIAGNOSIS — W57XXXA Bitten or stung by nonvenomous insect and other nonvenomous arthropods, initial encounter: Secondary | ICD-10-CM | POA: Diagnosis not present

## 2022-11-30 DIAGNOSIS — S80869A Insect bite (nonvenomous), unspecified lower leg, initial encounter: Secondary | ICD-10-CM | POA: Diagnosis not present

## 2023-01-08 DIAGNOSIS — L570 Actinic keratosis: Secondary | ICD-10-CM | POA: Diagnosis not present

## 2023-01-08 DIAGNOSIS — L820 Inflamed seborrheic keratosis: Secondary | ICD-10-CM | POA: Diagnosis not present

## 2023-01-08 DIAGNOSIS — D225 Melanocytic nevi of trunk: Secondary | ICD-10-CM | POA: Diagnosis not present

## 2023-01-08 DIAGNOSIS — L814 Other melanin hyperpigmentation: Secondary | ICD-10-CM | POA: Diagnosis not present

## 2023-01-08 DIAGNOSIS — L821 Other seborrheic keratosis: Secondary | ICD-10-CM | POA: Diagnosis not present

## 2023-01-08 DIAGNOSIS — D2272 Melanocytic nevi of left lower limb, including hip: Secondary | ICD-10-CM | POA: Diagnosis not present

## 2023-01-08 DIAGNOSIS — L578 Other skin changes due to chronic exposure to nonionizing radiation: Secondary | ICD-10-CM | POA: Diagnosis not present

## 2023-03-19 DIAGNOSIS — Z23 Encounter for immunization: Secondary | ICD-10-CM | POA: Diagnosis not present

## 2023-05-03 DIAGNOSIS — Z8546 Personal history of malignant neoplasm of prostate: Secondary | ICD-10-CM | POA: Diagnosis not present

## 2023-05-03 DIAGNOSIS — C61 Malignant neoplasm of prostate: Secondary | ICD-10-CM | POA: Diagnosis not present

## 2023-05-10 DIAGNOSIS — K8020 Calculus of gallbladder without cholecystitis without obstruction: Secondary | ICD-10-CM | POA: Diagnosis not present

## 2023-05-10 DIAGNOSIS — N289 Disorder of kidney and ureter, unspecified: Secondary | ICD-10-CM | POA: Diagnosis not present

## 2023-05-10 DIAGNOSIS — N281 Cyst of kidney, acquired: Secondary | ICD-10-CM | POA: Diagnosis not present

## 2023-05-21 DIAGNOSIS — R3912 Poor urinary stream: Secondary | ICD-10-CM | POA: Diagnosis not present

## 2023-05-21 DIAGNOSIS — C61 Malignant neoplasm of prostate: Secondary | ICD-10-CM | POA: Diagnosis not present

## 2023-05-21 DIAGNOSIS — N281 Cyst of kidney, acquired: Secondary | ICD-10-CM | POA: Diagnosis not present

## 2023-05-21 DIAGNOSIS — D49511 Neoplasm of unspecified behavior of right kidney: Secondary | ICD-10-CM | POA: Diagnosis not present

## 2023-06-03 ENCOUNTER — Other Ambulatory Visit: Payer: Self-pay | Admitting: Urology

## 2023-06-03 DIAGNOSIS — D49511 Neoplasm of unspecified behavior of right kidney: Secondary | ICD-10-CM

## 2023-06-11 ENCOUNTER — Ambulatory Visit
Admission: RE | Admit: 2023-06-11 | Discharge: 2023-06-11 | Disposition: A | Discharge status: Home / Self Care | Payer: Medicare Other | Source: Ambulatory Visit | Attending: Urology

## 2023-06-11 DIAGNOSIS — D49511 Neoplasm of unspecified behavior of right kidney: Secondary | ICD-10-CM

## 2023-06-11 HISTORY — PX: IR RADIOLOGIST EVAL & MGMT: IMG5224

## 2023-06-11 NOTE — Progress Notes (Incomplete)
Chief Complaint: Patient was seen in consultation today for No chief complaint on file.  at the request of Showalter,Victor C  Referring Physician(s): Showalter,Victor C  History of Present Illness: Terry [name] is a 82 y.o. male ***  Past Medical History:  Diagnosis Date   Arthritis    neck   Diabetes mellitus without complication (HCC)    Hypertension    Prostate cancer [facility])     Past Surgical History:  Procedure Laterality Date   EYE SURGERY Bilateral 1990   INGUINAL HERNIA REPAIR Bilateral 10/30/2020   Procedure: LAPAROSCOPIC BILATERAL INGUINAL HERNIA, BILATERAL OBTURATOR HERNIA  AND UMBILICAL REPAIRS WITH MESH;  Surgeon: Karie Soda, MD;  Location: [facility];  Service: General;  Laterality: Bilateral;   IR RADIOLOGIST EVAL & MGMT  06/11/2023   PROSTATE BIOPSY     TONSILLECTOMY     as a child    Allergies: Patient has no known allergies.  Medications: Prior to Admission medications   Medication Sig Start Date End Date Taking? Authorizing Provider  Ascorbic Acid (VITAMIN C PO) Take 1 tablet by mouth daily.    [provider]  atorvastatin (LIPITOR) 10 MG tablet Take 10 mg by mouth daily. 04/10/19   [provider]  Coenzyme Q10 (CO Q-10) 100 MG CAPS Take 100 mg by mouth daily.    [provider]  CONTOUR NEXT TEST test strip CHECK BLOOD SUGARS ONCE IN THE MORNING IN VITRO **E11.22** 05/13/19   [provider]  diazepam (VALIUM) 5 MG tablet Take 1 tablet (5 mg total) by mouth every 6 (six) hours as needed for muscle spasms (difficulty urinating). 10/30/20   Karie Soda, MD  lisinopril (ZESTRIL) 20 MG tablet Take 20 mg by mouth daily. 04/10/19   [provider]  metFORMIN (GLUCOPHAGE) 500 MG tablet Take 500 mg by mouth daily with breakfast.  05/21/19   [provider]  Multiple Vitamin (MULTIVITAMIN WITH MINERALS) TABS tablet Take 1 tablet by mouth daily.    [provider]  saw palmetto 500 MG capsule  Take 500 mg by mouth daily.    [provider]  tamsulosin (FLOMAX) 0.4 MG CAPS capsule Take 0.4 mg by mouth daily. 05/15/19   [provider]  traMADol (ULTRAM) 50 MG tablet Take 1-2 tablets (50-100 mg total) by mouth every 6 (six) hours as needed for moderate pain or severe pain. 10/30/20   Karie Soda, MD     Family History  Problem Relation Age of Onset   Prostate cancer Father    Bladder Cancer Father    Breast cancer Neg Hx    Colon cancer Neg Hx    Pancreatic cancer Neg Hx     Social History   Socioeconomic History   Marital status: Married    Spouse name: Veer Juby   Number of children: Not on file   Years of education: Not on file   Highest education level: Not on file  Occupational History    Comment: retired  Tobacco Use   Smoking status: Never   Smokeless tobacco: Never  Vaping Use   Vaping status: Never Used  Substance and Sexual Activity   Alcohol use: Never   Drug use: Never   Sexual activity: Yes  Other Topics Concern   Not on file  Social History Narrative   2 stepchildren and 2 daughters.   Social Drivers of Corporate investment banker Strain: Not on file  Food Insecurity: Not on file  Transportation Needs: Not  on file  Physical Activity: Not on file  Stress: Not on file  Social Connections: Not on file    ECOG Status: {CHL ONC ECOG AO:1308657846}  Review of Systems: A 12 point ROS discussed and pertinent positives are indicated in the HPI above.  All other systems are negative.  Review of Systems  Vital Signs: BP 126/77   Pulse 69   Temp 98.2 F (36.8 C)   Resp 20   SpO2 96%   Physical Exam   General: WN, NAD  CV: RRR on monitor Pulm: normal work of breathing on RA Abd: S, ND, NT MSK: Grossly normal Psych: Appropriate affect.      Imaging:    CT AP W WO, 05/10/23 IMPRESSION:  1. Increase in size of a mixed solid and cystic lesion of the posterior midportion of the right kidney, overall  dimensions 2.4 x 2.1 cm, previously 2.0 x 2.0 cm. Internal contrast enhancing nodular component at the superior aspect of the lesion is likewise increased in size, measuring 1.5 x 1.1 cm, previously 1.3 x 1.0 cm. 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  2. No evidence of renal vein invasion, lymphadenopathy or metastatic disease in the abdomen.  Labs:  CBC: No results for input(s): "WBC", "HGB", "HCT", "PLT" in the last 8760 hours.  COAGS: No results for input(s): "INR", "APTT" in the last 8760 hours.  BMP: No results for input(s): "NA", "K", "CL", "CO2", "GLUCOSE", "BUN", "CALCIUM", "CREATININE", "GFRNONAA", "GFRAA" in the last 8760 hours.  Invalid input(s): "CMP"  LIVER FUNCTION TESTS: No results for input(s): "BILITOT", "AST", "ALT", "ALKPHOS", "PROT", "ALBUMIN" in the last 8760 hours.  TUMOR MARKERS: No results for input(s): "AFPTM", "CEA", "CA199", "CHROMGRNA" in the last 8760 hours.  Assessment and Plan:  ***  Thank you for this interesting consult.  I greatly enjoyed meeting Patricia Rusak and look forward to participating in their care.  A copy of this report was sent to the requesting provider on this date.  Electronically Signed:  Roanna Banning, MD [facility]   Pager. [phone] Clinic. [phone]  I spent a total of {New INPT:304952001} {New Out-Pt:304952002}  {Established Out-Pt:304952003} in face to face in clinical consultation, greater than 50% of which was counseling/coordinating care for ***

## 2023-06-24 DIAGNOSIS — K8020 Calculus of gallbladder without cholecystitis without obstruction: Secondary | ICD-10-CM | POA: Diagnosis not present

## 2023-06-24 DIAGNOSIS — Z1331 Encounter for screening for depression: Secondary | ICD-10-CM | POA: Diagnosis not present

## 2023-06-24 DIAGNOSIS — R609 Edema, unspecified: Secondary | ICD-10-CM | POA: Diagnosis not present

## 2023-06-24 DIAGNOSIS — Z5181 Encounter for therapeutic drug level monitoring: Secondary | ICD-10-CM | POA: Diagnosis not present

## 2023-06-24 DIAGNOSIS — I700 Atherosclerosis of aorta: Secondary | ICD-10-CM | POA: Diagnosis not present

## 2023-06-24 DIAGNOSIS — N281 Cyst of kidney, acquired: Secondary | ICD-10-CM | POA: Diagnosis not present

## 2023-06-24 DIAGNOSIS — Z0000 Encounter for general adult medical examination without abnormal findings: Secondary | ICD-10-CM | POA: Diagnosis not present

## 2023-06-24 DIAGNOSIS — Z8546 Personal history of malignant neoplasm of prostate: Secondary | ICD-10-CM | POA: Diagnosis not present

## 2023-06-24 DIAGNOSIS — H9193 Unspecified hearing loss, bilateral: Secondary | ICD-10-CM | POA: Diagnosis not present

## 2023-06-24 DIAGNOSIS — I129 Hypertensive chronic kidney disease with stage 1 through stage 4 chronic kidney disease, or unspecified chronic kidney disease: Secondary | ICD-10-CM | POA: Diagnosis not present

## 2023-06-24 DIAGNOSIS — E1122 Type 2 diabetes mellitus with diabetic chronic kidney disease: Secondary | ICD-10-CM | POA: Diagnosis not present

## 2023-06-24 DIAGNOSIS — N1832 Chronic kidney disease, stage 3b: Secondary | ICD-10-CM | POA: Diagnosis not present

## 2023-06-24 DIAGNOSIS — E7800 Pure hypercholesterolemia, unspecified: Secondary | ICD-10-CM | POA: Diagnosis not present

## 2023-06-28 ENCOUNTER — Other Ambulatory Visit (HOSPITAL_COMMUNITY): Payer: Self-pay | Admitting: Interventional Radiology

## 2023-06-28 ENCOUNTER — Encounter: Payer: Self-pay | Admitting: Cardiovascular Disease

## 2023-06-28 ENCOUNTER — Ambulatory Visit: Payer: Medicare Other | Attending: Cardiovascular Disease | Admitting: Cardiovascular Disease

## 2023-06-28 VITALS — BP 138/70 | HR 55 | Ht 72.0 in | Wt 168.8 lb

## 2023-06-28 DIAGNOSIS — Z01818 Encounter for other preprocedural examination: Secondary | ICD-10-CM | POA: Insufficient documentation

## 2023-06-28 DIAGNOSIS — N2889 Other specified disorders of kidney and ureter: Secondary | ICD-10-CM

## 2023-06-28 NOTE — Progress Notes (Signed)
 Chief Complaint  Patient presents with   New Patient (Initial Visit)    Pre-operative risk assessment   History of Present Illness: 83 yo male with history of aortic atherosclerosis, DM, HLD, HTN, prostate cancer and stage 3 CKD here today as a new consult, referred by Dr. Hughes, for pre-operative cardiac risk assessment. He has been found to have a renal mass and has been seen by IR with plans for possible cryoablation and biopsy. No known heart disease. He has no chest pain, dyspnea, palpitations or LE edema. He has never smoked. He is very active around his house and yard. He continues to work in optician, dispensing.   Primary Care Physician: Charlott Dorn LABOR, MD  Past Medical History:  Diagnosis Date   Aortic atherosclerosis (HCC)    Arthritis    neck   BPH (benign prostatic hyperplasia)    Calculus of gallbladder    Diabetes mellitus without complication (HCC)    Edema    Gout    Hearing loss    Hx of colonic polyps    Hypercholesterolemia    Hypertension    Hypertensive renal disease    Prostate cancer (HCC)    Renal cyst    Right kidney mass    Stage 3b chronic kidney disease (CKD) (HCC)     Past Surgical History:  Procedure Laterality Date   EYE SURGERY Bilateral 1990   INGUINAL HERNIA REPAIR Bilateral 10/30/2020   Procedure: LAPAROSCOPIC BILATERAL INGUINAL HERNIA, BILATERAL OBTURATOR HERNIA  AND UMBILICAL REPAIRS WITH MESH;  Surgeon: Sheldon Standing, MD;  Location: [facility];  Service: General;  Laterality: Bilateral;   IR RADIOLOGIST EVAL & MGMT  06/11/2023   PROSTATE BIOPSY     TONSILLECTOMY     as a child   UMBILICAL HERNIA REPAIR      Current Outpatient Medications  Medication Sig Dispense Refill   Ascorbic Acid (VITAMIN C PO) Take 1 tablet by mouth daily.     Coenzyme Q10 (CO Q-10) 100 MG CAPS Take 100 mg by mouth daily.     CONTOUR NEXT TEST test strip CHECK BLOOD SUGARS ONCE IN THE MORNING IN VITRO **E11.22**     diazepam  (VALIUM ) 5 MG tablet Take 1  tablet (5 mg total) by mouth every 6 (six) hours as needed for muscle spasms (difficulty urinating). 5 tablet 1   lisinopril  (ZESTRIL ) 20 MG tablet Take 20 mg by mouth daily.     metFORMIN (GLUCOPHAGE) 500 MG tablet Take 500 mg by mouth daily with breakfast.      Multiple Vitamin (MULTIVITAMIN WITH MINERALS) TABS tablet Take 1 tablet by mouth daily.     saw palmetto 500 MG capsule Take 500 mg by mouth daily.     tamsulosin  (FLOMAX ) 0.4 MG CAPS capsule Take 0.4 mg by mouth daily.     traMADol  (ULTRAM ) 50 MG tablet Take 1-2 tablets (50-100 mg total) by mouth every 6 (six) hours as needed for moderate pain or severe pain. 20 tablet 0   atorvastatin (LIPITOR) 20 MG tablet Take 20 mg by mouth daily.     No current facility-administered medications for this visit.    No Known Allergies  Social History   Socioeconomic History   Marital status: Married    Spouse name: Khaleem Burchill   Number of children: 2   Years of education: Not on file   Highest education level: Not on file  Occupational History    Comment: retired  Tobacco Use   Smoking status: Never  Smokeless tobacco: Never  Vaping Use   Vaping status: Never Used  Substance and Sexual Activity   Alcohol use: Never   Drug use: Never   Sexual activity: Yes  Other Topics Concern   Not on file  Social History Narrative   2 stepchildren and 2 daughters.   Social Drivers of Corporate Investment Banker Strain: Not on file  Food Insecurity: Not on file  Transportation Needs: Not on file  Physical Activity: Not on file  Stress: Not on file  Social Connections: Not on file  Intimate Partner Violence: Not on file    Family History  Problem Relation Age of Onset   Cerebral aneurysm Mother    Prostate cancer Father    Bladder Cancer Father    Heart attack Father    Breast cancer Neg Hx    Colon cancer Neg Hx    Pancreatic cancer Neg Hx     Review of Systems:  As stated in the HPI and otherwise negative.   BP 138/70    Pulse (!) 55   Ht 6' (1.829 m)   Wt 76.6 kg   SpO2 97%   BMI 22.89 kg/m   Physical Examination: General: Well developed, well nourished, NAD  HEENT: OP clear, mucus membranes moist  SKIN: warm, dry. No rashes. Neuro: No focal deficits  Musculoskeletal: Muscle strength 5/5 all ext  Psychiatric: Mood and affect normal  Neck: No JVD, no carotid bruits, no thyromegaly, no lymphadenopathy.  Lungs:Clear bilaterally, no wheezes, rhonci, crackles Cardiovascular: Regular rate and rhythm. No murmurs, gallops or rubs. Abdomen:Soft. Bowel sounds present. Non-tender.  Extremities: No lower extremity edema. Pulses are 2 + in the bilateral DP/PT.  EKG:  EKG is ordered today. The ekg ordered today demonstrates  EKG Interpretation Date/Time:  Monday June 28 2023 14:07:49 EST Ventricular Rate:  55 PR Interval:  140 QRS Duration:  86 QT Interval:  396 QTC Calculation: 378 R Axis:   54  Text Interpretation: Sinus bradycardia When compared with ECG of 22-Oct-2020 09:18, No significant change was found Confirmed by Verlin Bruckner [phone]) on 06/28/2023 2:08:38 PM   Recent Labs: No results found for requested labs within last 365 days.   Lipid Panel No results found for: CHOL, TRIG, HDL, CHOLHDL, VLDL, LDLCALC, LDLDIRECT   Wt Readings from Last 3 Encounters:  06/28/23 76.6 kg  10/22/20 78 kg  06/27/19 77.7 kg    Assessment and Plan:   1. Pre-operative cardiovascular risk assessment: He has no chest pain suggestive of angina. No evidence of CHF. EKG is normal. Cardiac exam is normal. I do not think further cardiac workup is indicated at this time. He can proceed with his planned surgical procedure.   Labs/ tests ordered today include:   Orders Placed This Encounter  Procedures   EKG 12-Lead   Disposition:   F/U with me as needed  Signed, Bruckner Verlin, MD, [facility] 06/28/2023 2:22 PM    [facility] [address] [place], [place], [place]   [address] Phone: [phone]; Fax: [phone]

## 2023-06-28 NOTE — Patient Instructions (Signed)
 Medication Instructions:  No changes *If you need a refill on your cardiac medications before your next appointment, please call your pharmacy*   Lab Work: none If you have labs (blood work) drawn today and your tests are completely normal, you will receive your results only by: MyChart Message (if you have MyChart) OR A paper copy in the mail If you have any lab test that is abnormal or we need to change your treatment, we will call you to review the results.   Testing/Procedures: none   Follow-Up: As needed with Dr. Verlin

## 2023-07-02 ENCOUNTER — Other Ambulatory Visit (HOSPITAL_COMMUNITY): Payer: Self-pay | Admitting: Radiology

## 2023-07-02 DIAGNOSIS — D49511 Neoplasm of unspecified behavior of right kidney: Secondary | ICD-10-CM

## 2023-07-02 NOTE — Progress Notes (Signed)
Spoke with Cicero Duck. to request pre op orders.

## 2023-07-05 NOTE — Patient Instructions (Addendum)
SURGICAL WAITING ROOM VISITATION Patients having surgery or a procedure may have no more than 2 support people in the waiting area - these visitors may rotate.    Children under the age of 33 must have an adult with them who is not the patient.  Due to an increase in RSV and influenza rates and associated hospitalizations, children ages 103 and under may not visit patients in [facility] hospitals.   If the patient needs to stay at the hospital during part of their recovery, the visitor guidelines for inpatient rooms apply. Pre-op nurse will coordinate an appropriate time for 1 support person to accompany patient in pre-op.  This support person may not rotate.    Please refer to the [facility] website for the visitor guidelines for Inpatients (after your surgery is over and you are in a regular room).       Your procedure is scheduled on: 07-21-23   Report to [facility] Main Entrance    Report to admitting at 6:30 AM   Call this number if you have problems the morning of surgery [phone]   Do not eat food or drink liquids :After Midnight.           If you have questions, please contact your surgeon's office.   FOLLOW  ANY ADDITIONAL PRE OP INSTRUCTIONS YOU RECEIVED FROM YOUR SURGEON'S OFFICE!!!     Oral Hygiene is also important to reduce your risk of infection.                                    Remember - BRUSH YOUR TEETH THE MORNING OF SURGERY WITH YOUR REGULAR TOOTHPASTE   Do NOT smoke after Midnight   Take these medicines the morning of surgery with A SIP OF WATER:   Atorvastatin  Tamsulosin  Stop all vitamins and herbal supplements 7 days before surgery  How to Manage Your Diabetes Before and After Surgery  Why is it important to control my blood sugar before and after surgery? Improving blood sugar levels before and after surgery helps healing and can limit problems. A way of improving blood sugar control is eating a healthy diet by:  Eating less  sugar and carbohydrates  Increasing activity/exercise  Talking with your doctor about reaching your blood sugar goals High blood sugars (greater than 180 mg/dL) can raise your risk of infections and slow your recovery, so you will need to focus on controlling your diabetes during the weeks before surgery. Make sure that the doctor who takes care of your diabetes knows about your planned surgery including the date and location.  How do I manage my blood sugar before surgery? Check your blood sugar at least 4 times a day, starting 2 days before surgery, to make sure that the level is not too high or low. Check your blood sugar the morning of your surgery when you wake up and every 2 hours until you get to the Short Stay unit. If your blood sugar is less than 70 mg/dL, you will need to treat for low blood sugar: Do not take insulin. Treat a low blood sugar (less than 70 mg/dL) with  cup of clear juice (cranberry or apple), 4 glucose tablets, OR glucose gel. Recheck blood sugar in 15 minutes after treatment (to make sure it is greater than 70 mg/dL). If your blood sugar is not greater than 70 mg/dL on recheck, call [phone] for  further instructions. Report your blood sugar to the short stay nurse when you get to Short Stay.  If you are admitted to the hospital after surgery: Your blood sugar will be checked by the staff and you will probably be given insulin after surgery (instead of oral diabetes medicines) to make sure you have good blood sugar levels. The goal for blood sugar control after surgery is 80-180 mg/dL.   WHAT DO I DO ABOUT MY DIABETES MEDICATION?  Do not take oral diabetes medicines (pills) the morning of surgery. (Do not take Metformin morning of surgery)  DO NOT TAKE THE FOLLOWING 7 DAYS PRIOR TO SURGERY: Ozempic, Wegovy, Rybelsus (Semaglutide), Byetta (exenatide), Bydureon (exenatide ER), Victoza, Saxenda (liraglutide), or Trulicity (dulaglutide) Mounjaro (Tirzepatide)  Adlyxin (Lixisenatide), Polyethylene Glycol Loxenatide.  Reviewed and Endorsed by [facility] Patient Education Committee, August 2015  Bring CPAP mask and tubing day of surgery.                              You may not have any metal on your body including  jewelry, and body piercing             Do not wear lotions, powders, cologne, or deodorant              Men may shave face and neck.   Do not bring valuables to the hospital. [facility] IS NOT RESPONSIBLE   FOR VALUABLES.   Contacts, dentures or bridgework may not be worn into surgery.   Bring small overnight bag day of surgery.   DO NOT BRING YOUR HOME MEDICATIONS TO THE HOSPITAL. PHARMACY WILL DISPENSE MEDICATIONS LISTED ON YOUR MEDICATION LIST TO YOU DURING YOUR ADMISSION IN THE HOSPITAL!     Special Instructions: Bring a copy of your healthcare power of attorney and living will documents the day of surgery if you haven't scanned them before.              Please read over the following fact sheets you were given: IF YOU HAVE QUESTIONS ABOUT YOUR PRE-OP INSTRUCTIONS PLEASE CALL [phone]  If you received a COVID test during your pre-op visit  it is requested that you wear a mask when out in public, stay away from anyone that may not be feeling well and notify your surgeon if you develop symptoms. If you test positive for Covid or have been in contact with anyone that has tested positive in the last 10 days please notify you surgeon.  [facility] - Preparing for Surgery Before surgery, you can play an important role.  Because skin is not sterile, your skin needs to be as free of germs as possible.  You can reduce the number of germs on your skin by washing with CHG (chlorahexidine gluconate) soap before surgery.  CHG is an antiseptic cleaner which kills germs and bonds with the skin to continue killing germs even after washing. Please DO NOT use if you have an allergy to CHG or antibacterial soaps.  If your skin becomes  reddened/irritated stop using the CHG and inform your nurse when you arrive at Short Stay. Do not shave (including legs and underarms) for at least 48 hours prior to the first CHG shower.  You may shave your face/neck.  Please follow these instructions carefully:  1.  Shower with CHG Soap the night before surgery and the  morning of surgery.  2.  If you choose to wash your hair,  wash your hair first as usual with your normal  shampoo.  3.  After you shampoo, rinse your hair and body thoroughly to remove the shampoo.                             4.  Use CHG as you would any other liquid soap.  You can apply chg directly to the skin and wash.  Gently with a scrungie or clean washcloth.  5.  Apply the CHG Soap to your body ONLY FROM THE NECK DOWN.   Do   not use on face/ open                           Wound or open sores. Avoid contact with eyes, ears mouth and   genitals (private parts).                       Wash face,  Genitals (private parts) with your normal soap.             6.  Wash thoroughly, paying special attention to the area where your    surgery  will be performed.  7.  Thoroughly rinse your body with warm water from the neck down.  8.  DO NOT shower/wash with your normal soap after using and rinsing off the CHG Soap.                9.  Pat yourself dry with a clean towel.            10.  Wear clean pajamas.            11.  Place clean sheets on your bed the night of your first shower and do not  sleep with pets. Day of Surgery : Do not apply any lotions/deodorants the morning of surgery.  Please wear clean clothes to the hospital/surgery center.  FAILURE TO FOLLOW THESE INSTRUCTIONS MAY RESULT IN THE CANCELLATION OF YOUR SURGERY  PATIENT SIGNATURE_________________________________  NURSE SIGNATURE__________________________________  ________________________________________________________________________

## 2023-07-06 ENCOUNTER — Encounter (HOSPITAL_COMMUNITY): Payer: Self-pay

## 2023-07-06 ENCOUNTER — Ambulatory Visit (HOSPITAL_COMMUNITY)
Admission: RE | Admit: 2023-07-06 | Discharge: 2023-07-06 | Disposition: A | Discharge status: Home / Self Care | Payer: Medicare Other | Source: Ambulatory Visit | Attending: Radiology

## 2023-07-06 ENCOUNTER — Encounter (HOSPITAL_COMMUNITY)
Admission: RE | Admit: 2023-07-06 | Discharge: 2023-07-06 | Disposition: A | Discharge status: Home / Self Care | Payer: Medicare Other | Source: Ambulatory Visit | Attending: Interventional Radiology | Admitting: Interventional Radiology

## 2023-07-06 ENCOUNTER — Other Ambulatory Visit: Payer: Self-pay

## 2023-07-06 VITALS — BP 131/86 | HR 71 | Temp 97.4°F | Resp 16 | Ht 71.0 in | Wt 166.0 lb

## 2023-07-06 DIAGNOSIS — D49511 Neoplasm of unspecified behavior of right kidney: Secondary | ICD-10-CM | POA: Diagnosis not present

## 2023-07-06 DIAGNOSIS — E119 Type 2 diabetes mellitus without complications: Secondary | ICD-10-CM | POA: Insufficient documentation

## 2023-07-06 DIAGNOSIS — Z01818 Encounter for other preprocedural examination: Secondary | ICD-10-CM | POA: Insufficient documentation

## 2023-07-06 LAB — HEMOGLOBIN A1C
Hgb A1c MFr Bld: 7.1 % — ABNORMAL HIGH (ref 4.8–5.6)
Mean Plasma Glucose: 157.07 mg/dL

## 2023-07-06 LAB — PROTIME-INR
INR: 1 (ref 0.8–1.2)
Prothrombin Time: 13.3 s (ref 11.4–15.2)

## 2023-07-06 LAB — CBC WITH DIFFERENTIAL/PLATELET
Abs Immature Granulocytes: 0.02 10*3/uL (ref 0.00–0.07)
Basophils Absolute: 0 10*3/uL (ref 0.0–0.1)
Basophils Relative: 1 %
Eosinophils Absolute: 0.3 10*3/uL (ref 0.0–0.5)
Eosinophils Relative: 4 %
HCT: 42.7 % (ref 39.0–52.0)
Hemoglobin: 13.7 g/dL (ref 13.0–17.0)
Immature Granulocytes: 0 %
Lymphocytes Relative: 21 %
Lymphs Abs: 1.3 10*3/uL (ref 0.7–4.0)
MCH: 28.1 pg (ref 26.0–34.0)
MCHC: 32.1 g/dL (ref 30.0–36.0)
MCV: 87.7 fL (ref 80.0–100.0)
Monocytes Absolute: 0.5 10*3/uL (ref 0.1–1.0)
Monocytes Relative: 9 %
Neutro Abs: 4.1 10*3/uL (ref 1.7–7.7)
Neutrophils Relative %: 65 %
Platelets: 203 10*3/uL (ref 150–400)
RBC: 4.87 MIL/uL (ref 4.22–5.81)
RDW: 13.2 % (ref 11.5–15.5)
WBC: 6.2 10*3/uL (ref 4.0–10.5)
nRBC: 0 % (ref 0.0–0.2)

## 2023-07-06 LAB — BASIC METABOLIC PANEL
Anion gap: 10 (ref 5–15)
BUN: 23 mg/dL (ref 8–23)
CO2: 22 mmol/L (ref 22–32)
Calcium: 10 mg/dL (ref 8.9–10.3)
Chloride: 106 mmol/L (ref 98–111)
Creatinine, Ser: 1.46 mg/dL — ABNORMAL HIGH (ref 0.61–1.24)
GFR, Estimated: 47 mL/min — ABNORMAL LOW (ref >60)
Glucose, Bld: 150 mg/dL — ABNORMAL HIGH (ref 70–99)
Potassium: 4.4 mmol/L (ref 3.5–5.1)
Sodium: 138 mmol/L (ref 135–145)

## 2023-07-06 LAB — GLUCOSE, CAPILLARY: Glucose-Capillary: 134 mg/dL — ABNORMAL HIGH (ref 70–99)

## 2023-07-06 NOTE — Progress Notes (Addendum)
COVID Vaccine received:  []  No [x]  Yes Date of any COVID positive Test in last 90 days: no PCP - Eleanora Neighbor  Cardiologist - Earney Hamburg Cardiac clearance in epic 06/28/23  Chest x-ray -  EKG -  06/28/23 Epic Stress Test -  ECHO -  Cardiac Cath -   Bowel Prep - [x]  No  []   Yes ______  Pacemaker / ICD device [x]  No []  Yes   Spinal Cord Stimulator:[x]  No []  Yes       History of Sleep Apnea? [x]  No []  Yes   CPAP used?- [x]  No []  Yes    Does the patient monitor blood sugar?          []  No [x]  Yes  []  N/A  Patient has: []  NO Hx DM   []  Pre-DM                 []  DM1  [x]   DM2 Does patient have a [employer] or Dexacom? [x]  No []  Yes   Fasting Blood Sugar Ranges- 138-145 Checks Blood Sugar __3___ times a week  GLP1 agonist / usual dose - no GLP1 instructions:  SGLT-2 inhibitors / usual dose - no SGLT-2 instructions:   Blood Thinner / Instructions:no Aspirin Instructions:no  Comments:   Activity level: Patient is able  to climb a flight of stairs without difficulty; [x]  No CP  [x]  No SOB, ___   Patient can perform ADLs without assistance.   Anesthesia review:   Patient denies shortness of breath, fever, cough and chest pain at PAT appointment.  Patient verbalized understanding and agreement to the Pre-Surgical Instructions that were given to them at this PAT appointment. Patient was also educated of the need to review these PAT instructions again prior to his/her surgery.I reviewed the appropriate phone numbers to call if they have any and questions or concerns.

## 2023-07-15 DIAGNOSIS — Z23 Encounter for immunization: Secondary | ICD-10-CM | POA: Diagnosis not present

## 2023-07-19 DIAGNOSIS — L570 Actinic keratosis: Secondary | ICD-10-CM | POA: Diagnosis not present

## 2023-07-19 DIAGNOSIS — L820 Inflamed seborrheic keratosis: Secondary | ICD-10-CM | POA: Diagnosis not present

## 2023-07-20 ENCOUNTER — Other Ambulatory Visit: Payer: Self-pay | Admitting: Radiology

## 2023-07-20 DIAGNOSIS — N2889 Other specified disorders of kidney and ureter: Secondary | ICD-10-CM

## 2023-07-20 NOTE — H&P (Signed)
 Chief Complaint: Right renal mass  Referring Provider(s): Sowalter, Steffan  Supervising Physician: Hughes Simmonds  Patient Status: [facility] - Out-pt  History of Present Illness: Terry [name] is a 83 y.o. male with medical issues including diabetes, stage 3 CKD, and prostate cancer.  He had an incidental finding of a suspicious cystic right renal mass on prostate CA workup done 05/31/19.  He was followed closely by urology with serial imaging.  Most recent imaging confirms enlargement of the mass.  He was referred to IR for minimally invasive treatment.  He is not a good surgical nephrectomy candidate given his comorbidities and his concern with surgical risks.  He has been cleared from a cardiac standpoint from Dr. Verlin whose note reads = Pre-operative cardiovascular risk assessment: He has no chest pain suggestive of angina. No evidence of CHF. EKG is normal. Cardiac exam is normal. I do not think further cardiac workup is indicated at this time. He can proceed with his planned surgical procedure.   He is here today for right renal mass cryoablation with biopsy.  He is NPO.   No nausea/vomiting. No Fever/chills. ROS negative.   Patient is Full Code  Past Medical History:  Diagnosis Date   Aortic atherosclerosis (HCC)    Arthritis    neck   BPH (benign prostatic hyperplasia)    Calculus of gallbladder    Diabetes mellitus without complication (HCC)    Edema    Gout    Hearing loss    Hx of colonic polyps    Hypercholesterolemia    Hypertension    Hypertensive renal disease    Prostate cancer (HCC)    Renal cyst    Right kidney mass    Stage 3b chronic kidney disease (CKD) (HCC)     Past Surgical History:  Procedure Laterality Date   EYE SURGERY Bilateral 1990   INGUINAL HERNIA REPAIR Bilateral 10/30/2020   Procedure: LAPAROSCOPIC BILATERAL INGUINAL HERNIA, BILATERAL OBTURATOR HERNIA  AND UMBILICAL REPAIRS WITH MESH;  Surgeon: Sheldon Standing, MD;   Location: [facility] ORS;  Service: General;  Laterality: Bilateral;   IR RADIOLOGIST EVAL & MGMT  06/11/2023   PROSTATE BIOPSY     TONSILLECTOMY     as a child   UMBILICAL HERNIA REPAIR      Allergies: Patient has no known allergies.  Medications: Prior to Admission medications   Medication Sig Start Date End Date Taking? Authorizing Provider  Ascorbic Acid (VITAMIN C PO) Take 1 tablet by mouth daily.    [provider]  atorvastatin (LIPITOR) 20 MG tablet Take 20 mg by mouth daily.    [provider]  Coenzyme Q10 (CO Q-10) 100 MG CAPS Take 100 mg by mouth daily.    [provider]  CONTOUR NEXT TEST test strip CHECK BLOOD SUGARS ONCE IN THE MORNING IN VITRO **E11.22** 05/13/19   [provider]  diazepam  (VALIUM ) 5 MG tablet Take 1 tablet (5 mg total) by mouth every 6 (six) hours as needed for muscle spasms (difficulty urinating). Patient not taking: Reported on 07/01/2023 10/30/20   Sheldon Standing, MD  lisinopril  (ZESTRIL ) 20 MG tablet Take 20 mg by mouth daily. 04/10/19   [provider]  metFORMIN (GLUCOPHAGE) 500 MG tablet Take 500 mg by mouth daily with breakfast.  05/21/19   [provider]  Multiple Vitamin (MULTIVITAMIN WITH MINERALS) TABS tablet Take 1 tablet by mouth daily.    [provider]  saw palmetto 500 MG capsule Take 500  mg by mouth daily.    [provider]  tamsulosin  (FLOMAX ) 0.4 MG CAPS capsule Take 0.4 mg by mouth daily. 05/15/19   [provider]  traMADol  (ULTRAM ) 50 MG tablet Take 1-2 tablets (50-100 mg total) by mouth every 6 (six) hours as needed for moderate pain or severe pain. Patient not taking: Reported on 07/01/2023 10/30/20   Sheldon Standing, MD     Family History  Problem Relation Age of Onset   Cerebral aneurysm Mother    Prostate cancer Father    Bladder Cancer Father    Heart attack Father    Breast cancer Neg Hx    Colon cancer Neg Hx    Pancreatic cancer Neg Hx      Social History   Socioeconomic History   Marital status: Married    Spouse name: Kellon Chalk   Number of children: 2   Years of education: Not on file   Highest education level: Not on file  Occupational History    Comment: retired  Tobacco Use   Smoking status: Never   Smokeless tobacco: Never  Vaping Use   Vaping status: Never Used  Substance and Sexual Activity   Alcohol use: Never   Drug use: Never   Sexual activity: Yes  Other Topics Concern   Not on file  Social History Narrative   2 stepchildren and 2 daughters.   Social Drivers of Corporate Investment Banker Strain: Not on file  Food Insecurity: Not on file  Transportation Needs: Not on file  Physical Activity: Not on file  Stress: Not on file  Social Connections: Not on file    Review of Systems: A 12 point ROS discussed and pertinent positives are indicated in the HPI above.  All other systems are negative.   Vital Signs: There were no vitals taken for this visit.  Advance Care Plan: The advanced care place/surrogate decision maker was discussed at the time of visit and the patient did not wish to discuss or was not able to name a surrogate decision maker or provide an advance care plan.  Physical Exam Vitals reviewed.  Constitutional:      Appearance: Normal appearance.  HENT:     Head: Normocephalic and atraumatic.  Eyes:     Extraocular Movements: Extraocular movements intact.  Cardiovascular:     Rate and Rhythm: Normal rate and regular rhythm.  Pulmonary:     Effort: Pulmonary effort is normal. No respiratory distress.     Breath sounds: Normal breath sounds.  Abdominal:     General: There is no distension.     Palpations: Abdomen is soft.     Tenderness: There is no abdominal tenderness.  Musculoskeletal:        General: Normal range of motion.     Cervical back: Normal range of motion.  Skin:    General: Skin is warm and dry.  Neurological:     General: No focal deficit  present.     Mental Status: He is alert and oriented to person, place, and time.  Psychiatric:        Mood and Affect: Mood normal.        Behavior: Behavior normal.        Thought Content: Thought content normal.        Judgment: Judgment normal.     Imaging:       CT AP W WO, 05/10/23 IMPRESSION:  1. Increase in size of a mixed solid and cystic lesion  of the posterior midportion of the right kidney, overall dimensions 2.4 x 2.1 cm, previously 2.0 x 2.0 cm. Internal contrast enhancing nodular component at the superior aspect of the lesion is likewise increased in size, measuring 1.5 x 1.1 cm, previously 1.3 x 1.0 cm. 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  2. No evidence of renal vein invasion, lymphadenopathy or metastatic disease in the abdomen.    Labs:  CBC: Recent Labs    07/06/23 0935 07/21/23 0655  WBC 6.2 4.6  HGB 13.7 12.4*  HCT 42.7 39.6  PLT 203 198    COAGS: Recent Labs    07/06/23 0935 07/21/23 0655  INR 1.0 1.1  APTT  --  28    BMP: Recent Labs    07/06/23 0935 07/21/23 0655  NA 138 140  K 4.4 3.9  CL 106 109  CO2 22 23  GLUCOSE 150* 143*  BUN 23 28*  CALCIUM 10.0 9.6  CREATININE 1.46* 1.37*  GFRNONAA 47* 51*    LIVER FUNCTION TESTS: No results for input(s): BILITOT, AST, ALT, ALKPHOS, PROT, ALBUMIN in the last 8760 hours.  TUMOR MARKERS: No results for input(s): AFPTM, CEA, CA199, CHROMGRNA in the last 8760 hours.  Assessment and Plan:  Right renal mass  Will proceed with image guided cryoablation today by Dr. Hughes.  Risks and benefits of image guided renal cryoablation was discussed with the patient including, but not limited to, failure to treat entire lesion, bleeding, infection, damage to adjacent structures, hematuria, urine leak, decrease in renal function or post procedural neuropathy.  All of the patient's questions were answered and the  patient is agreeable to proceed. Consent signed and in chart.   Thank you for allowing our service to participate in Terry [name] 's care.  Electronically Signed: SARI GORMAN LAMP, PA-C   07/21/2023, 8:06 AM      I spent a total of    25 Minutes in face to face in clinical consultation, greater than 50% of which was counseling/coordinating care for right renal cryoablation.

## 2023-07-21 ENCOUNTER — Ambulatory Visit (HOSPITAL_COMMUNITY)
Admission: RE | Admit: 2023-07-21 | Discharge: 2023-07-21 | Discharge status: Home / Self Care | Payer: Medicare Other | Source: Ambulatory Visit | Attending: Interventional Radiology

## 2023-07-21 ENCOUNTER — Other Ambulatory Visit: Payer: Self-pay | Admitting: Physician Assistant

## 2023-07-21 ENCOUNTER — Other Ambulatory Visit (HOSPITAL_COMMUNITY): Payer: Self-pay

## 2023-07-21 ENCOUNTER — Encounter (HOSPITAL_COMMUNITY): Payer: Self-pay | Admitting: Interventional Radiology

## 2023-07-21 ENCOUNTER — Encounter (HOSPITAL_COMMUNITY): Payer: Self-pay

## 2023-07-21 ENCOUNTER — Encounter (HOSPITAL_COMMUNITY)
Admission: RE | Disposition: A | Discharge status: Home / Self Care | Payer: Self-pay | Source: Ambulatory Visit | Attending: Interventional Radiology

## 2023-07-21 ENCOUNTER — Other Ambulatory Visit: Payer: Self-pay

## 2023-07-21 ENCOUNTER — Ambulatory Visit (HOSPITAL_COMMUNITY): Payer: Medicare Other | Admitting: Anesthesiology

## 2023-07-21 ENCOUNTER — Observation Stay (HOSPITAL_COMMUNITY)
Admission: RE | Admit: 2023-07-21 | Discharge: 2023-07-22 | Disposition: A | Discharge status: Home / Self Care | Payer: Medicare Other | Source: Ambulatory Visit | Attending: Interventional Radiology | Admitting: Interventional Radiology

## 2023-07-21 ENCOUNTER — Ambulatory Visit (HOSPITAL_BASED_OUTPATIENT_CLINIC_OR_DEPARTMENT_OTHER): Payer: Medicare Other | Admitting: Anesthesiology

## 2023-07-21 DIAGNOSIS — N1832 Chronic kidney disease, stage 3b: Secondary | ICD-10-CM | POA: Diagnosis not present

## 2023-07-21 DIAGNOSIS — I129 Hypertensive chronic kidney disease with stage 1 through stage 4 chronic kidney disease, or unspecified chronic kidney disease: Secondary | ICD-10-CM | POA: Diagnosis not present

## 2023-07-21 DIAGNOSIS — C61 Malignant neoplasm of prostate: Secondary | ICD-10-CM | POA: Diagnosis not present

## 2023-07-21 DIAGNOSIS — Z7984 Long term (current) use of oral hypoglycemic drugs: Secondary | ICD-10-CM | POA: Insufficient documentation

## 2023-07-21 DIAGNOSIS — N2889 Other specified disorders of kidney and ureter: Secondary | ICD-10-CM | POA: Diagnosis not present

## 2023-07-21 DIAGNOSIS — E1122 Type 2 diabetes mellitus with diabetic chronic kidney disease: Secondary | ICD-10-CM | POA: Insufficient documentation

## 2023-07-21 DIAGNOSIS — Z8546 Personal history of malignant neoplasm of prostate: Secondary | ICD-10-CM | POA: Diagnosis not present

## 2023-07-21 DIAGNOSIS — E119 Type 2 diabetes mellitus without complications: Secondary | ICD-10-CM

## 2023-07-21 DIAGNOSIS — D49511 Neoplasm of unspecified behavior of right kidney: Secondary | ICD-10-CM | POA: Diagnosis not present

## 2023-07-21 DIAGNOSIS — Z79899 Other long term (current) drug therapy: Secondary | ICD-10-CM | POA: Diagnosis not present

## 2023-07-21 HISTORY — PX: RADIOLOGY WITH ANESTHESIA: SHX6223

## 2023-07-21 LAB — TYPE AND SCREEN
ABO/RH(D): A POS
Antibody Screen: NEGATIVE

## 2023-07-21 LAB — BASIC METABOLIC PANEL
Anion gap: 8 (ref 5–15)
BUN: 28 mg/dL — ABNORMAL HIGH (ref 8–23)
CO2: 23 mmol/L (ref 22–32)
Calcium: 9.6 mg/dL (ref 8.9–10.3)
Chloride: 109 mmol/L (ref 98–111)
Creatinine, Ser: 1.37 mg/dL — ABNORMAL HIGH (ref 0.61–1.24)
GFR, Estimated: 51 mL/min — ABNORMAL LOW (ref >60)
Glucose, Bld: 143 mg/dL — ABNORMAL HIGH (ref 70–99)
Potassium: 3.9 mmol/L (ref 3.5–5.1)
Sodium: 140 mmol/L (ref 135–145)

## 2023-07-21 LAB — GLUCOSE, CAPILLARY
Glucose-Capillary: 132 mg/dL — ABNORMAL HIGH (ref 70–99)
Glucose-Capillary: 127 mg/dL — ABNORMAL HIGH (ref 70–99)
Glucose-Capillary: 133 mg/dL — ABNORMAL HIGH (ref 70–99)
Glucose-Capillary: 132 mg/dL — ABNORMAL HIGH (ref 70–99)

## 2023-07-21 LAB — CBC
HCT: 39.6 % (ref 39.0–52.0)
Hemoglobin: 12.4 g/dL — ABNORMAL LOW (ref 13.0–17.0)
MCH: 28 pg (ref 26.0–34.0)
MCHC: 31.3 g/dL (ref 30.0–36.0)
MCV: 89.4 fL (ref 80.0–100.0)
Platelets: 198 10*3/uL (ref 150–400)
RBC: 4.43 MIL/uL (ref 4.22–5.81)
RDW: 13.3 % (ref 11.5–15.5)
WBC: 4.6 10*3/uL (ref 4.0–10.5)
nRBC: 0 % (ref 0.0–0.2)

## 2023-07-21 LAB — APTT: aPTT: 28 s (ref 24–36)

## 2023-07-21 LAB — ABO/RH: ABO/RH(D): A POS

## 2023-07-21 LAB — PROTIME-INR
INR: 1.1 (ref 0.8–1.2)
Prothrombin Time: 13.9 s (ref 11.4–15.2)

## 2023-07-21 SURGERY — CT WITH ANESTHESIA
Anesthesia: General | Laterality: Right

## 2023-07-21 MED ORDER — ONDANSETRON HCL 4 MG/2ML IJ SOLN
INTRAMUSCULAR | Status: DC | PRN
  Administered 2023-07-21: 4 mg via INTRAVENOUS

## 2023-07-21 MED ORDER — BUPIVACAINE HCL 0.5 % IJ SOLN
INTRAMUSCULAR | Status: AC | PRN
  Administered 2023-07-21: 30 mL

## 2023-07-21 MED ORDER — FENTANYL CITRATE PF 50 MCG/ML IJ SOSY
25.0000 ug | PREFILLED_SYRINGE | INTRAMUSCULAR | Status: DC | PRN
Start: 2023-07-21 — End: 2023-07-21

## 2023-07-21 MED ORDER — LACTATED RINGERS IV SOLN: INTRAVENOUS | Status: DC | PRN

## 2023-07-21 MED ORDER — FENTANYL CITRATE (PF) 100 MCG/2ML IJ SOLN
INTRAMUSCULAR | Status: AC
  Filled 2023-07-21: qty 2

## 2023-07-21 MED ORDER — ACETAMINOPHEN 500 MG PO TABS
1000.0000 mg | ORAL_TABLET | Freq: Once | ORAL | Status: AC
  Administered 2023-07-21: 1000 mg via ORAL
  Filled 2023-07-21: qty 2

## 2023-07-21 MED ORDER — OXYCODONE HCL 5 MG PO TABS
5.0000 mg | ORAL_TABLET | ORAL | 0 refills | Status: AC | PRN
  Filled 2023-07-21: qty 15, 3d supply, fill #0

## 2023-07-21 MED ORDER — IOHEXOL 300 MG/ML  SOLN
80.0000 mL | Freq: Once | INTRAMUSCULAR | Status: AC | PRN
  Administered 2023-07-21: 80 mL via INTRAVENOUS

## 2023-07-21 MED ORDER — IOHEXOL 300 MG/ML  SOLN: 100.0000 mL | Freq: Once | INTRAMUSCULAR | Status: DC | PRN

## 2023-07-21 MED ORDER — CHLORHEXIDINE GLUCONATE 0.12 % MT SOLN
15.0000 mL | Freq: Once | OROMUCOSAL | Status: AC
  Administered 2023-07-21: 15 mL via OROMUCOSAL

## 2023-07-21 MED ORDER — BUPIVACAINE HCL (PF) 0.5 % IJ SOLN
INTRAMUSCULAR | Status: AC
  Filled 2023-07-21: qty 30

## 2023-07-21 MED ORDER — TAMSULOSIN HCL 0.4 MG PO CAPS
0.4000 mg | ORAL_CAPSULE | Freq: Every day | ORAL | Status: DC
  Administered 2023-07-22: 0.4 mg via ORAL
  Filled 2023-07-21: qty 1

## 2023-07-21 MED ORDER — OXYCODONE HCL 5 MG/5ML PO SOLN: 5.0000 mg | Freq: Once | ORAL | Status: DC | PRN

## 2023-07-21 MED ORDER — ORAL CARE MOUTH RINSE: 15.0000 mL | Freq: Once | OROMUCOSAL | Status: AC

## 2023-07-21 MED ORDER — ROCURONIUM BROMIDE 10 MG/ML (PF) SYRINGE
PREFILLED_SYRINGE | INTRAVENOUS | Status: DC | PRN
  Administered 2023-07-21: 60 mg via INTRAVENOUS
  Administered 2023-07-21: 20 mg via INTRAVENOUS

## 2023-07-21 MED ORDER — LIDOCAINE 1% INJECTION FOR CIRCUMCISION
INJECTION | INTRAVENOUS | Status: AC | PRN
  Administered 2023-07-21: 10 mL via SUBCUTANEOUS

## 2023-07-21 MED ORDER — DIAZEPAM 5 MG PO TABS
5.0000 mg | ORAL_TABLET | Freq: Four times a day (QID) | ORAL | Status: DC | PRN
Start: 2023-07-21 — End: 2023-07-22

## 2023-07-21 MED ORDER — PROPOFOL 10 MG/ML IV BOLUS
INTRAVENOUS | Status: DC | PRN
  Administered 2023-07-21: 130 mg via INTRAVENOUS

## 2023-07-21 MED ORDER — GLYCOPYRROLATE PF 0.2 MG/ML IJ SOSY
PREFILLED_SYRINGE | INTRAMUSCULAR | Status: DC | PRN
  Administered 2023-07-21: .2 mg via INTRAVENOUS

## 2023-07-21 MED ORDER — FENTANYL CITRATE (PF) 100 MCG/2ML IJ SOLN
INTRAMUSCULAR | Status: DC | PRN
  Administered 2023-07-21: 50 ug via INTRAVENOUS

## 2023-07-21 MED ORDER — PHENYLEPHRINE HCL-NACL 20-0.9 MG/250ML-% IV SOLN
INTRAVENOUS | Status: DC | PRN
  Administered 2023-07-21: 25 ug/min via INTRAVENOUS

## 2023-07-21 MED ORDER — SENNOSIDES-DOCUSATE SODIUM 8.6-50 MG PO TABS: 1.0000 | ORAL_TABLET | Freq: Every day | ORAL | Status: DC | PRN

## 2023-07-21 MED ORDER — ONDANSETRON HCL 4 MG/2ML IJ SOLN: 4.0000 mg | Freq: Four times a day (QID) | INTRAMUSCULAR | Status: DC | PRN

## 2023-07-21 MED ORDER — HYDROCODONE-ACETAMINOPHEN 5-325 MG PO TABS
1.0000 | ORAL_TABLET | ORAL | Status: DC | PRN
  Administered 2023-07-22: 1 via ORAL
  Filled 2023-07-21: qty 1

## 2023-07-21 MED ORDER — LIDOCAINE 2% (20 MG/ML) 5 ML SYRINGE
INTRAMUSCULAR | Status: DC | PRN
  Administered 2023-07-21: 100 mg via INTRAVENOUS

## 2023-07-21 MED ORDER — OXYCODONE HCL 5 MG PO TABS: 10.0000 mg | ORAL_TABLET | Freq: Four times a day (QID) | ORAL | Status: DC | PRN

## 2023-07-21 MED ORDER — DIPHENHYDRAMINE HCL 25 MG PO CAPS: 25.0000 mg | ORAL_CAPSULE | Freq: Three times a day (TID) | ORAL | Status: DC | PRN

## 2023-07-21 MED ORDER — DROPERIDOL 2.5 MG/ML IJ SOLN: 0.6250 mg | Freq: Once | INTRAMUSCULAR | Status: DC | PRN

## 2023-07-21 MED ORDER — CHLORHEXIDINE GLUCONATE CLOTH 2 % EX PADS: 6.0000 | MEDICATED_PAD | Freq: Every day | CUTANEOUS | Status: DC

## 2023-07-21 MED ORDER — SODIUM CHLORIDE 0.9 % IV SOLN
2.0000 g | INTRAVENOUS | Status: AC
  Administered 2023-07-21: 2 g via INTRAVENOUS
  Filled 2023-07-21: qty 2

## 2023-07-21 MED ORDER — DOCUSATE SODIUM 100 MG PO CAPS
100.0000 mg | ORAL_CAPSULE | Freq: Two times a day (BID) | ORAL | Status: DC
  Administered 2023-07-21 – 2023-07-22 (×2): 100 mg via ORAL
  Filled 2023-07-21 (×2): qty 1

## 2023-07-21 MED ORDER — INSULIN ASPART 100 UNIT/ML IJ SOLN: 0.0000 [IU] | INTRAMUSCULAR | Status: DC | PRN

## 2023-07-21 MED ORDER — LISINOPRIL 20 MG PO TABS
20.0000 mg | ORAL_TABLET | Freq: Every day | ORAL | Status: DC
  Administered 2023-07-21: 20 mg via ORAL
  Filled 2023-07-21 (×2): qty 1

## 2023-07-21 MED ORDER — SODIUM CHLORIDE 0.9 % IV SOLN
INTRAVENOUS | Status: AC
  Filled 2023-07-21: qty 500

## 2023-07-21 MED ORDER — INSULIN ASPART 100 UNIT/ML IJ SOLN
0.0000 [IU] | INTRAMUSCULAR | Status: DC
  Administered 2023-07-21 – 2023-07-22 (×3): 2 [IU] via SUBCUTANEOUS

## 2023-07-21 MED ORDER — SUGAMMADEX SODIUM 200 MG/2ML IV SOLN
INTRAVENOUS | Status: DC | PRN
  Administered 2023-07-21: 200 mg via INTRAVENOUS

## 2023-07-21 MED ORDER — EPHEDRINE SULFATE-NACL 50-0.9 MG/10ML-% IV SOSY
PREFILLED_SYRINGE | INTRAVENOUS | Status: DC | PRN
  Administered 2023-07-21: 5 mg via INTRAVENOUS

## 2023-07-21 MED ORDER — OXYCODONE HCL 5 MG PO TABS: 5.0000 mg | ORAL_TABLET | Freq: Once | ORAL | Status: DC | PRN

## 2023-07-21 NOTE — Procedures (Signed)
 Vascular and Interventional Radiology Procedure Note  Patient: Terry [name] DOB: 06/12/1941 Medical Record Number: [id number] Note Date/Time: 07/21/23 9:39 AM   Performing Physician: Thom Hall, MD Assistant(s): None  Diagnosis: R cystic renal mass   Procedure:  PERCUTANEOUS CRYOABLATION of RIGHT RENAL MASS RIGHT RENAL MASS BIOPSY   Anesthesia: General Anesthesia Complications: None Estimated Blood Loss: Minimal Specimens: Sent for Pathology   Findings:  Successful CT-guided biopsy of R renal mass. A total of 3 samples were obtained.   Successful CT-guided cryoablation using 2x IceForce 2.1 CX probes. Contrasted post-ablation CT with adequate lesional coverage.  Hemostasis of the tract was achieved using Manual Pressure.  Plan: Bed rest for 3 hours.  See detailed procedure note with images in PACS. The patient tolerated the procedure well without incident or complication and was returned to PACU in stable condition.    Thom Hall, MD [facility] Radiology   Pager. [phone] Clinic. [phone]

## 2023-07-21 NOTE — Progress Notes (Signed)
  Post procedure visit  Patient seen while in PACU.  Doing well. Pain controlled well.  Still has not voided. IVFs going, patient drinking water.  Wife has already picked up Oxycodone  for home. Patient understands to take OTC laxative for narcotic induced constipation.  IR schedulers will call patient with f/u appointment details.  OK to D/C home when he voids.   Maevis Mumby S Angeleen Horney PA-C 07/21/2023 2:50 PM

## 2023-07-21 NOTE — Plan of Care (Signed)
   Problem: Education: Goal: Knowledge of General Education information will improve Description Including pain rating scale, medication(s)/side effects and non-pharmacologic comfort measures Outcome: Progressing   Problem: Health Behavior/Discharge Planning: Goal: Ability to manage health-related needs will improve Outcome: Progressing

## 2023-07-21 NOTE — Anesthesia Postprocedure Evaluation (Signed)
 Anesthesia Post Note  Patient: Terry [name]  Procedure(s) Performed: CT WITH ANESTHESIA CRYOABLATION OF RIGHT RENAL MASS (Right)     Patient location during evaluation: PACU Anesthesia Type: General Level of consciousness: awake and alert Pain management: pain level controlled Vital Signs Assessment: post-procedure vital signs reviewed and stable Respiratory status: spontaneous breathing, nonlabored ventilation and respiratory function stable Cardiovascular status: blood pressure returned to baseline Postop Assessment: no apparent nausea or vomiting Anesthetic complications: no   No notable events documented.  Last Vitals:  Vitals:   07/21/23 1245 07/21/23 1300  BP: 122/67 124/73  Pulse: 62 66  Resp: 13 10  Temp: (!) 36 C 36.5 C  SpO2: 95% 94%    Last Pain:  Vitals:   07/21/23 1300  TempSrc:   PainSc: 0-No pain                 Vertell Row

## 2023-07-21 NOTE — Anesthesia Procedure Notes (Signed)
 Procedure Name: Intubation Date/Time: 07/21/2023 8:59 AM  Performed by: Zulema Leita PARAS, CRNAPre-anesthesia Checklist: Patient identified, Emergency Drugs available, Suction available and Patient being monitored Patient Re-evaluated:Patient Re-evaluated prior to induction Oxygen Delivery Method: Circle system utilized Preoxygenation: Pre-oxygenation with 100% oxygen Induction Type: IV induction Ventilation: Mask ventilation without difficulty Laryngoscope Size: Mac and 4 Grade View: Grade I Tube type: Oral Tube size: 7.5 mm Number of attempts: 1 Airway Equipment and Method: Stylet Placement Confirmation: ETT inserted through vocal cords under direct vision, positive ETCO2 and breath sounds checked- equal and bilateral Secured at: 21 cm Tube secured with: Tape Dental Injury: Teeth and Oropharynx as per pre-operative assessment

## 2023-07-21 NOTE — Sedation Documentation (Signed)
 Crna managing patient care, please see charting & vitals. Patient positioned on table prone, appropriate padding to bony areas. Patient safety positioned per anesthesia staff. Secured to table with safety straps.

## 2023-07-21 NOTE — Anesthesia Preprocedure Evaluation (Addendum)
 Anesthesia Evaluation  Patient identified by MRN, date of birth, ID band Patient awake    Reviewed: Allergy & Precautions, NPO status , Patient's Chart, lab work & pertinent test results  History of Anesthesia Complications Negative for: history of anesthetic complications  Airway Mallampati: II  TM Distance: >3 FB Neck ROM: Full    Dental no notable dental hx.    Pulmonary neg pulmonary ROS   Pulmonary exam normal        Cardiovascular hypertension, Pt. on medications Normal cardiovascular exam     Neuro/Psych negative neurological ROS  negative psych ROS   GI/Hepatic negative GI ROS, Neg liver ROS,,,  Endo/Other  diabetes, Type 2, Oral Hypoglycemic Agents    Renal/GU Renal InsufficiencyRenal diseaseRenal mass  negative genitourinary   Musculoskeletal  (+) Arthritis ,    Abdominal   Peds  Hematology negative hematology ROS (+)   Anesthesia Other Findings Day of surgery medications reviewed with patient.  Reproductive/Obstetrics negative OB ROS                             Anesthesia Physical Anesthesia Plan  ASA: 2  Anesthesia Plan: General   Post-op Pain Management: Tylenol  PO (pre-op)*   Induction: Intravenous  PONV Risk Score and Plan: 2 and Dexamethasone , Ondansetron  and Treatment may vary due to age or medical condition  Airway Management Planned: Oral ETT  Additional Equipment: None  Intra-op Plan:   Post-operative Plan: Extubation in OR  Informed Consent: I have reviewed the patients History and Physical, chart, labs and discussed the procedure including the risks, benefits and alternatives for the proposed anesthesia with the patient or authorized representative who has indicated his/her understanding and acceptance.     Dental advisory given  Plan Discussed with: CRNA  Anesthesia Plan Comments:         Anesthesia Quick Evaluation

## 2023-07-21 NOTE — Transfer of Care (Signed)
 Immediate Anesthesia Transfer of Care Note  Patient: Terry [name]  Procedure(s) Performed: CT WITH ANESTHESIA CRYOABLATION OF RIGHT RENAL MASS (Right)  Patient Location: PACU  Anesthesia Type:General  Level of Consciousness: drowsy  Airway & Oxygen Therapy: Patient Spontanous Breathing and Patient connected to face mask oxygen  Post-op Assessment: Report given to RN and Post -op Vital signs reviewed and stable  Post vital signs: Reviewed and stable  Last Vitals:  Vitals Value Taken Time  BP 149/87 07/21/23 1140  Temp    Pulse 84 07/21/23 1141  Resp 5 07/21/23 1141  SpO2 100 % 07/21/23 1141  Vitals shown include unfiled device data.  Last Pain:  Vitals:   07/21/23 0642  TempSrc: Oral         Complications: No notable events documented.

## 2023-07-22 ENCOUNTER — Encounter (HOSPITAL_COMMUNITY): Payer: Self-pay | Admitting: Interventional Radiology

## 2023-07-22 DIAGNOSIS — Z79899 Other long term (current) drug therapy: Secondary | ICD-10-CM | POA: Diagnosis not present

## 2023-07-22 DIAGNOSIS — I129 Hypertensive chronic kidney disease with stage 1 through stage 4 chronic kidney disease, or unspecified chronic kidney disease: Secondary | ICD-10-CM | POA: Diagnosis not present

## 2023-07-22 DIAGNOSIS — N2889 Other specified disorders of kidney and ureter: Secondary | ICD-10-CM | POA: Diagnosis not present

## 2023-07-22 DIAGNOSIS — E1122 Type 2 diabetes mellitus with diabetic chronic kidney disease: Secondary | ICD-10-CM | POA: Diagnosis not present

## 2023-07-22 DIAGNOSIS — Z8546 Personal history of malignant neoplasm of prostate: Secondary | ICD-10-CM | POA: Diagnosis not present

## 2023-07-22 DIAGNOSIS — N1832 Chronic kidney disease, stage 3b: Secondary | ICD-10-CM | POA: Diagnosis not present

## 2023-07-22 LAB — GLUCOSE, CAPILLARY
Glucose-Capillary: 114 mg/dL — ABNORMAL HIGH (ref 70–99)
Glucose-Capillary: 121 mg/dL — ABNORMAL HIGH (ref 70–99)
Glucose-Capillary: 131 mg/dL — ABNORMAL HIGH (ref 70–99)

## 2023-07-22 LAB — SURGICAL PATHOLOGY

## 2023-07-22 NOTE — Plan of Care (Signed)

## 2023-07-22 NOTE — Care Management Obs Status (Signed)
 MEDICARE OBSERVATION STATUS NOTIFICATION   Patient Details  Name: Terry [name] MRN: [id number] Date of Birth: September 02, 1940   Medicare Observation Status Notification Given:  Yes    MahabirThersia Flax, RN 07/22/2023, 10:11 AM

## 2023-07-22 NOTE — Progress Notes (Signed)
Reviewed written d/c instructions w pt and all questions answered. He verbalized understanding. D/ C via w/c w all belongings in stable condition.

## 2023-07-22 NOTE — Discharge Summary (Addendum)
 Patient ID: Terry [name] MRN: [id number] DOB/AGE: 1941/03/10 83 y.o.  Admit date: 07/21/2023 Discharge date: 07/22/2023  Supervising Physician: Luverne Aran  Patient Status: [facility] - In-pt  Admission Diagnoses: right renal mass  Discharge Diagnoses:  Principal Problem:   Renal mass, right   Discharged Condition: good  Hospital Course:   Patient presented to [facility] IR for an image-guided right renal mass cryoablation and bx with Dr. Hughes yesterday. The procedure occurred without major complications and patient was transferred to floor in stable condition (VSS, right flank puncture site stable) for overnight observation. No major events occurred overnight.   Patient awake and alert  no complaints at this time. Foley catheter removed without difficulty. Patient denies right flank pain. Able to void, little bit of blood in urine but no clots.  right flank puncture site stable. Plan to discharge home today and follow-up with Dr. Hughes for televisit 3-4 weeks after discharge.   Consults: None  Significant Diagnostic Studies: Renal mass sample sent for surgical pathology   Treatments: Cryoablation of the right renal mass    Discharge Exam: Blood pressure 105/63, pulse 79, temperature 99.1 F (37.3 C), temperature source Oral, resp. rate 18, height 5' 11 (1.803 m), weight 166 lb (75.3 kg), SpO2 91%.  Physical Exam Vitals and nursing note reviewed.  Constitutional:      General: Patient is not in acute distress.    Appearance: Normal appearance. Patient is not ill-appearing.  HENT:     Head: Normocephalic and atraumatic.   Cardiovascular:     Rate and Rhythm: Normal rate and regular rhythm.     Pulses: Normal pulses.     Heart sounds: Normal heart sounds.  Pulmonary:     Effort: Pulmonary effort is normal.     Breath sounds: Normal breath sounds.  Abdominal:     General: Abdomen is flat. Bowel sounds are normal.     Palpations: Abdomen is soft.  Musculoskeletal:      Cervical back: Neck supple.  Skin:    General: Skin is warm and dry.     Coloration: Skin is not jaundiced or pale.  Two puncture sites in the right flank area. Site w/o appreciable hematoma or ecchymosis, soft and non tender.   Neurological:     Mental Status: Patient is alert and oriented to person, place, and time.  Psychiatric:        Mood and Affect: Mood normal.        Behavior: Behavior normal.        Judgment: Judgment normal.    Disposition: Discharge disposition: 01-Home or Self Care       Discharge Instructions     Call MD for:   Complete by: As directed    Call MD for:  difficulty breathing, headache or visual disturbances   Complete by: As directed    Call MD for:  extreme fatigue   Complete by: As directed    Call MD for:  hives   Complete by: As directed    Call MD for:  persistant dizziness or light-headedness   Complete by: As directed    Call MD for:  persistant nausea and vomiting   Complete by: As directed    Call MD for:  redness, tenderness, or signs of infection (pain, swelling, redness, odor or green/yellow discharge around incision site)   Complete by: As directed    Call MD for:  severe uncontrolled pain   Complete by: As directed    Call MD  for:  temperature >100.4   Complete by: As directed    Diet - low sodium heart healthy   Complete by: As directed    Discharge wound care:   Complete by: As directed    Keep the right flank puncture sites clean and dry, do not submerge (swimming or soaking in a bathtub) for 7 days. OK to take shower.   Increase activity slowly   Complete by: As directed    No lifting, bending, stooping more than 10 pounds for 1 week   No dressing needed   Complete by: As directed       Allergies as of 07/22/2023   No Known Allergies      Medication List     TAKE these medications    atorvastatin 20 MG tablet Commonly known as: LIPITOR Take 20 mg by mouth daily.   Co Q-10 100 MG Caps Take 100 mg by mouth  daily.   Contour Next Test test strip Generic drug: glucose blood CHECK BLOOD SUGARS ONCE IN THE MORNING IN VITRO **E11.22**   diazepam  5 MG tablet Commonly known as: VALIUM  Take 1 tablet (5 mg total) by mouth every 6 (six) hours as needed for muscle spasms (difficulty urinating).   lisinopril  20 MG tablet Commonly known as: ZESTRIL  Take 20 mg by mouth daily.   metFORMIN 500 MG tablet Commonly known as: GLUCOPHAGE Take 500 mg by mouth daily with breakfast.   multivitamin with minerals Tabs tablet Take 1 tablet by mouth daily.   oxyCODONE  5 MG immediate release tablet Commonly known as: Roxicodone  Take 1 tablet (5 mg total) by mouth every 4 (four) hours as needed for severe pain (pain score 7-10).   saw palmetto 500 MG capsule Take 500 mg by mouth daily.   tamsulosin  0.4 MG Caps capsule Commonly known as: FLOMAX  Take 0.4 mg by mouth daily.   traMADol  50 MG tablet Commonly known as: ULTRAM  Take 1-2 tablets (50-100 mg total) by mouth every 6 (six) hours as needed for moderate pain or severe pain.   VITAMIN C PO Take 1 tablet by mouth daily.               Discharge Care Instructions  (From admission, onward)           Start     Ordered   07/22/23 0000  Discharge wound care:       Comments: Keep the right flank puncture sites clean and dry, do not submerge (swimming or soaking in a bathtub) for 7 days. OK to take shower.   07/22/23 0949   07/22/23 0000  No dressing needed        07/22/23 9050            Follow-up Information     Hughes Simmonds, MD Follow up.   Specialties: Interventional Radiology, Diagnostic Radiology, Radiology Contact information: [address] [place] [address] [phone]         [facility] IR Imaging Follow up.   Specialty: Radiology Why: Follow up visit with Dr. Hughes in 3-4 weeks. Our schedulers will call you to set up the appointment. Contact information: [address] [place]  [place] [address] [phone]                 Electronically Signed: Toya VEAR Cousin, PA-C 07/22/2023, 9:55 AM   I have spent Less Than 30 Minutes discharging Lynwood Greener.

## 2023-07-22 NOTE — TOC Transition Note (Signed)
 Transition of Care [facility]) - Discharge Note   Patient Details  Name: Terry [name] MRN: [id number] Date of Birth: 07-Sep-1940  Transition of Care [facility]) CM/SW Contact:  Bascom Service, RN Phone Number: 07/22/2023, 10:13 AM   Clinical Narrative:   d/c home    Final next level of care: Home/Self Care Barriers to Discharge: No Barriers Identified   Patient Goals and CMS Choice            Discharge Placement                       Discharge Plan and Services Additional resources added to the After Visit Summary for                                       Social Drivers of Health (SDOH) Interventions SDOH Screenings   Food Insecurity: No Food Insecurity (07/21/2023)  Housing: Low Risk  (07/21/2023)  Transportation Needs: No Transportation Needs (07/21/2023)  Utilities: Not At Risk (07/21/2023)  Social Connections: Moderately Integrated (07/22/2023)  Tobacco Use: Low Risk  (07/21/2023)     Readmission Risk Interventions     No data to display

## 2023-07-22 NOTE — Discharge Instructions (Signed)
Cryoablation of Renal Tumors, Care After  This sheet gives you information about how to care for yourself after your procedure. Your health care provider may also give you more specific instructions. If you have problems or questions, contact your health care provider.  What can I expect after the procedure? After your procedure, it is common to have pain and discomfort in the upper abdomen. You may be given prescription pain medicines to control this if the pain is severe.   Follow these instructions at home: Puncture site care Follow instructions from your health care provider about how to take care of your incision. Make sure you:  - Wash your hands with soap and water before you change your bandage (dressing). If soap and water are not available, use hand sanitizer.  - Ok to shower 48 hours following procedure. Recommend showering with bandage on, remove bandage immediately after showering and pat area dry. No further dressing changes needed after this- ensure area remains clean and dry until fully healed.  - No submerging (swimming, bathing) for 7 days post-procedure. Check your puncture site area every day for signs of infection. Check for: - Redness, swelling, or pain. - Fluid or blood. - Warmth. - Pus or a bad smell. Activity Rest as often as needing during the first few days of recovery. No stooping, bending, or lifting more than 10 pounds for 1 week.  General instructions To prevent or treat constipation while you are taking prescription pain medicine, your health care provider may recommend that you: - Drink enough fluid to keep your urine clear or pale yellow. - Take over-the-counter or prescription medicines. - Eat foods that are high in fiber, such as fresh fruits and vegetables, whole grains, and beans. - Limit foods that are high in fat and processed sugars, such as fried and sweet foods. - Do not use any products that contain nicotine or tobacco, such as cigarettes and  e-cigarettes. If you need help quitting, ask your health care provider. - Keep all follow-up visits as told by your health care provider. This is important.  3-4 week televisit with the doctor who performed the procedure. Our office will call you to set up the appointment.   Contact a health care provider if: You cannot pass gas. You are unable to have a bowel movement within 3 days. You have a skin rash.  Get help right away if: You have a fever. You have severe or lasting pain in your abdomen, shoulder, or back. You have trouble swallowing or breathing. You have severe weakness or dizziness. You have chest pain or shortness of breath.   This information is not intended to replace advice given to you by your health care provider. Make sure you discuss any questions you have with your health care provider. 

## 2023-08-18 ENCOUNTER — Ambulatory Visit
Admission: RE | Admit: 2023-08-18 | Discharge: 2023-08-18 | Disposition: A | Discharge status: Home / Self Care | Payer: Medicare Other | Source: Ambulatory Visit | Attending: Physician Assistant | Admitting: Physician Assistant

## 2023-08-18 ENCOUNTER — Other Ambulatory Visit (HOSPITAL_COMMUNITY): Payer: Medicare Other

## 2023-08-18 DIAGNOSIS — N2889 Other specified disorders of kidney and ureter: Secondary | ICD-10-CM | POA: Diagnosis not present

## 2023-08-18 HISTORY — PX: IR RADIOLOGIST EVAL & MGMT: IMG5224

## 2023-08-18 NOTE — Progress Notes (Signed)
 Reason for visit: Follow up R renal mass Bx and ablation   Care Team(s) PCP:  Terry Aspen, MD Urology: Terry Brook MD  Virtual Visit via Video Conferencing  I connected with [name] on 08/18/23 by video-telephonic conference and verified that I am speaking with the correct person using two identifiers. I discussed the limitations, risks, security and privacy concerns of performing an evaluation and management service by tele-visit and the availability of in-person appointments.  History of present illness:  [name] is a 83 y.o. male w PMHx significant for T2DM on metformin, prostate CA (Dx 04/2019, Gleason 4+3=7) s/p XRT.  Pt w incidental, suspicious cystic R renal mass on prostate CA workup w CT AP (05/31/19) and enlarged on serial imaging. Pt is closely followed by urology and was referred for minimally invasive treatment given his comorbidities and patient concern with surgical option.   He underwent successful cryoablation and biopsy on 07/21/23, and was discharged without event after overnight observation. He is joined in the visit by his spouse, Terry [name], and they report that he is back to his baseline health. He however reported "tightness" at his procedural site initially, foley catheter-related discomfort and a mild throat soreness from intubation. These have all since abated.     ROS positive for LUTS, w reported chronic straining w urination. Pt is on tamsulosin 0.4 mg QD.  He denies any unintentional weight loss, hematuria or localizing symptoms.   Review of Systems: A 12-point ROS discussed, and pertinent positives are indicated in the HPI above.  All other systems are negative   Past Medical History:  Diagnosis Date   Aortic atherosclerosis (HCC)    Arthritis    neck   BPH (benign prostatic hyperplasia)    Calculus of gallbladder    Diabetes mellitus without complication (HCC)    Edema    Gout    Hearing loss    Hx of colonic polyps     Hypercholesterolemia    Hypertension    Hypertensive renal disease    Prostate cancer (HCC)    Renal cyst    Right kidney mass    Stage 3b chronic kidney disease (CKD) (HCC)     Past Surgical History:  Procedure Laterality Date   EYE SURGERY Bilateral 1990   INGUINAL HERNIA REPAIR Bilateral 10/30/2020   Procedure: LAPAROSCOPIC BILATERAL INGUINAL HERNIA, BILATERAL OBTURATOR HERNIA  AND UMBILICAL REPAIRS WITH MESH;  Surgeon: Karie Soda, MD;  Location: [facility] ORS;  Service: General;  Laterality: Bilateral;   IR RADIOLOGIST EVAL & MGMT  06/11/2023   IR RADIOLOGIST EVAL & MGMT  08/18/2023   PROSTATE BIOPSY     RADIOLOGY WITH ANESTHESIA Right 07/21/2023   Procedure: CT WITH ANESTHESIA CRYOABLATION OF RIGHT RENAL MASS;  Surgeon: Roanna Banning, MD;  Location: [facility] ORS;  Service: Radiology;  Laterality: Right;   TONSILLECTOMY     as a child   UMBILICAL HERNIA REPAIR      Allergies: Patient has no known allergies.  Medications: Prior to Admission medications   Medication Sig Start Date End Date Taking? Authorizing Provider  Ascorbic Acid (VITAMIN C PO) Take 1 tablet by mouth daily.    [provider]  atorvastatin (LIPITOR) 20 MG tablet Take 20 mg by mouth daily.    [provider]  Coenzyme Q10 (CO Q-10) 100 MG CAPS Take 100 mg by mouth daily.    [provider]  CONTOUR NEXT TEST test strip CHECK BLOOD SUGARS ONCE IN THE MORNING  IN VITRO **E11.22** 05/13/19   [provider]  diazepam (VALIUM) 5 MG tablet Take 1 tablet (5 mg total) by mouth every 6 (six) hours as needed for muscle spasms (difficulty urinating). Patient not taking: Reported on 07/01/2023 10/30/20   Karie Soda, MD  lisinopril (ZESTRIL) 20 MG tablet Take 20 mg by mouth daily. 04/10/19   [provider]  metFORMIN (GLUCOPHAGE) 500 MG tablet Take 500 mg by mouth daily with breakfast.  05/21/19   [provider]  Multiple Vitamin (MULTIVITAMIN WITH MINERALS) TABS tablet Take 1  tablet by mouth daily.    [provider]  oxyCODONE (ROXICODONE) 5 MG immediate release tablet Take 1 tablet (5 mg total) by mouth every 4 (four) hours as needed for severe pain (pain score 7-10). 07/21/23   Gershon Crane, PA-C  saw palmetto 500 MG capsule Take 500 mg by mouth daily.    [provider]  tamsulosin (FLOMAX) 0.4 MG CAPS capsule Take 0.4 mg by mouth daily. 05/15/19   [provider]  traMADol (ULTRAM) 50 MG tablet Take 1-2 tablets (50-100 mg total) by mouth every 6 (six) hours as needed for moderate pain or severe pain. Patient not taking: Reported on 07/01/2023 10/30/20   Karie Soda, MD     Family History  Problem Relation Age of Onset   Cerebral aneurysm Mother    Prostate cancer Father    Bladder Cancer Father    Heart attack Father    Breast cancer Neg Hx    Colon cancer Neg Hx    Pancreatic cancer Neg Hx     Social History   Socioeconomic History   Marital status: Married    Spouse name: Terry [name]   Number of children: 2   Years of education: Not on file   Highest education level: Not on file  Occupational History    Comment: retired  Tobacco Use   Smoking status: Never   Smokeless tobacco: Never  Vaping Use   Vaping status: Never Used  Substance and Sexual Activity   Alcohol use: Never   Drug use: Never   Sexual activity: Yes  Other Topics Concern   Not on file  Social History Narrative   2 stepchildren and 2 daughters.   Social Drivers of Corporate investment banker Strain: Not on file  Food Insecurity: No Food Insecurity (07/21/2023)   Hunger Vital Sign    Worried About Running Out of Food in the Last Year: Never true    Ran Out of Food in the Last Year: Never true  Transportation Needs: No Transportation Needs (07/21/2023)   PRAPARE - Administrator, Civil Service (Medical): No    Lack of Transportation (Non-Medical): No  Physical Activity: Not on file  Stress: Not on file  Social Connections:  Moderately Integrated (07/22/2023)   Social Connection and Isolation Panel [NHANES]    Frequency of Communication with Friends and Family: Three times a week    Frequency of Social Gatherings with Friends and Family: Twice a week    Attends Religious Services: More than 4 times per year    Active Member of [employer] or Organizations: No    Attends Banker Meetings: Never    Marital Status: Married     Vital Signs: There were no vitals taken for this visit.  Physical Exam Deferred secondary to virtual visit.  Imaging:  IR R renal mass cryoablation: 07/21/23 Adequate R renal mass targeting and ablation.  No results found.  Labs:  CBC: Recent Labs    07/06/23 0935 07/21/23 0655  WBC 6.2 4.6  HGB 13.7 12.4*  HCT 42.7 39.6  PLT 203 198    COAGS: Recent Labs    07/06/23 0935 07/21/23 0655  INR 1.0 1.1  APTT  --  28    BMP: Recent Labs    07/06/23 0935 07/21/23 0655  NA 138 140  K 4.4 3.9  CL 106 109  CO2 22 23  GLUCOSE 150* 143*  BUN 23 28*  CALCIUM 10.0 9.6  CREATININE 1.46* 1.37*  GFRNONAA 47* 51*    SURGICAL PATHOLOGY CASE: [id number] PATIENT: Terry [name] Surgical Pathology Report  Clinical History: R cystic renal mass (las)  FINAL MICROSCOPIC DIAGNOSIS:  A. 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 Baseline sCr ~1.4.  Pt now s/p R renal mass biopsy and cryoablation on 07/21/23 Pathology negative, favor sampling error.   *Pt back to baseline. No post procedural concern. *3 mos follow up imaging w multiphasic CT post ablation (07/21/23), no sooner. *Will see him back in clinic, virtual option, for imaging  review in 3 mos. *Pt will also follow up with his Urologist.   Thank you for allowing [place] to participate in the care of this Patient. Please contact me with questions, concerns, or if new issues arise.  As part of this video-telephonic encounter, no in-person exam was conducted.   The patient was physically located in [place] or a state in which I am permitted to provide care. The encounter was reasonable and appropriate under the circumstances given the patient's presentation at the time.  Electronically Signed:  Roanna Banning, MD [facility] Radiology   Pager. [phone] Clinic. [phone]  I spent a total of 30 Minutes of non-face-to-face time in clinical consultation, greater than 50% of which was counseling/coordinating care for [name] treatment for R renal mass.

## 2023-08-20 DIAGNOSIS — D49511 Neoplasm of unspecified behavior of right kidney: Secondary | ICD-10-CM | POA: Diagnosis not present

## 2023-08-20 DIAGNOSIS — N401 Enlarged prostate with lower urinary tract symptoms: Secondary | ICD-10-CM | POA: Diagnosis not present

## 2023-08-20 DIAGNOSIS — N451 Epididymitis: Secondary | ICD-10-CM | POA: Diagnosis not present

## 2023-08-20 DIAGNOSIS — Z8546 Personal history of malignant neoplasm of prostate: Secondary | ICD-10-CM | POA: Diagnosis not present

## 2023-08-20 DIAGNOSIS — R351 Nocturia: Secondary | ICD-10-CM | POA: Diagnosis not present

## 2023-09-16 DIAGNOSIS — H353231 Exudative age-related macular degeneration, bilateral, with active choroidal neovascularization: Secondary | ICD-10-CM | POA: Diagnosis not present

## 2023-09-16 DIAGNOSIS — E119 Type 2 diabetes mellitus without complications: Secondary | ICD-10-CM | POA: Diagnosis not present

## 2023-09-16 DIAGNOSIS — H52203 Unspecified astigmatism, bilateral: Secondary | ICD-10-CM | POA: Diagnosis not present

## 2023-10-26 DIAGNOSIS — D485 Neoplasm of uncertain behavior of skin: Secondary | ICD-10-CM | POA: Diagnosis not present

## 2023-10-26 DIAGNOSIS — L820 Inflamed seborrheic keratosis: Secondary | ICD-10-CM | POA: Diagnosis not present

## 2023-10-26 DIAGNOSIS — L821 Other seborrheic keratosis: Secondary | ICD-10-CM | POA: Diagnosis not present

## 2023-10-26 DIAGNOSIS — D0421 Carcinoma in situ of skin of right ear and external auricular canal: Secondary | ICD-10-CM | POA: Diagnosis not present

## 2023-11-10 DIAGNOSIS — C44222 Squamous cell carcinoma of skin of right ear and external auricular canal: Secondary | ICD-10-CM | POA: Diagnosis not present

## 2023-11-11 DIAGNOSIS — D49511 Neoplasm of unspecified behavior of right kidney: Secondary | ICD-10-CM | POA: Diagnosis not present

## 2023-11-23 DIAGNOSIS — K8020 Calculus of gallbladder without cholecystitis without obstruction: Secondary | ICD-10-CM | POA: Diagnosis not present

## 2023-11-23 DIAGNOSIS — N400 Enlarged prostate without lower urinary tract symptoms: Secondary | ICD-10-CM | POA: Diagnosis not present

## 2023-11-23 DIAGNOSIS — K5730 Diverticulosis of large intestine without perforation or abscess without bleeding: Secondary | ICD-10-CM | POA: Diagnosis not present

## 2023-11-23 DIAGNOSIS — D49511 Neoplasm of unspecified behavior of right kidney: Secondary | ICD-10-CM | POA: Diagnosis not present

## 2023-11-23 DIAGNOSIS — C61 Malignant neoplasm of prostate: Secondary | ICD-10-CM | POA: Diagnosis not present

## 2023-12-01 ENCOUNTER — Other Ambulatory Visit: Payer: Self-pay | Admitting: Interventional Radiology

## 2023-12-01 DIAGNOSIS — C61 Malignant neoplasm of prostate: Secondary | ICD-10-CM | POA: Diagnosis not present

## 2023-12-01 DIAGNOSIS — D49511 Neoplasm of unspecified behavior of right kidney: Secondary | ICD-10-CM | POA: Diagnosis not present

## 2023-12-01 DIAGNOSIS — R351 Nocturia: Secondary | ICD-10-CM | POA: Diagnosis not present

## 2023-12-01 DIAGNOSIS — N401 Enlarged prostate with lower urinary tract symptoms: Secondary | ICD-10-CM | POA: Diagnosis not present

## 2023-12-06 ENCOUNTER — Ambulatory Visit
Admission: RE | Admit: 2023-12-06 | Discharge: 2023-12-06 | Disposition: A | Discharge status: Home / Self Care | Source: Ambulatory Visit | Attending: Interventional Radiology

## 2023-12-06 DIAGNOSIS — Z5189 Encounter for other specified aftercare: Secondary | ICD-10-CM | POA: Diagnosis not present

## 2023-12-06 DIAGNOSIS — D49511 Neoplasm of unspecified behavior of right kidney: Secondary | ICD-10-CM | POA: Diagnosis not present

## 2023-12-06 DIAGNOSIS — L820 Inflamed seborrheic keratosis: Secondary | ICD-10-CM | POA: Diagnosis not present

## 2023-12-06 HISTORY — PX: IR RADIOLOGIST EVAL & MGMT: IMG5224

## 2023-12-06 NOTE — Progress Notes (Signed)
 Reason for visit: Follow up R renal mass Bx and ablation   Care Team(s) PCP:  Charlott Dorn LABOR, MD Urology: Shane Steffan BROCKS MD   History of present illness:   [name] is a 83 y.o. male w PMHx significant for T2DM on metformin, prostate CA (Dx 04/2019, Gleason 4+3=7) s/p XRT.  Pt w incidental, suspicious cystic R renal mass on prostate CA workup w CT AP (05/31/19) and enlarged on serial imaging. Pt is closely followed by urology and was referred for minimally invasive treatment given his comorbidities and patient concern with surgical option.    He underwent successful cryoablation and biopsy on 07/21/23, and was discharged without event after overnight observation. He was last seen on initial post procedural follow up and returns today for initial imaging review, 3 mos post ablation. He reportst that he is back to his baseline health. He denies any unintentional weight loss, hematuria or localizing symptoms.   Review of Systems: A 12-point ROS discussed, and pertinent positives are indicated in the HPI above.  All other systems are negative   Past Medical History:  Diagnosis Date   Aortic atherosclerosis (HCC)    Arthritis    neck   BPH (benign prostatic hyperplasia)    Calculus of gallbladder    Diabetes mellitus without complication (HCC)    Edema    Gout    Hearing loss    Hx of colonic polyps    Hypercholesterolemia    Hypertension    Hypertensive renal disease    Prostate cancer (HCC)    Renal cyst    Right kidney mass    Stage 3b chronic kidney disease (CKD) (HCC)     Past Surgical History:  Procedure Laterality Date   EYE SURGERY Bilateral 1990   INGUINAL HERNIA REPAIR Bilateral 10/30/2020   Procedure: LAPAROSCOPIC BILATERAL INGUINAL HERNIA, BILATERAL OBTURATOR HERNIA  AND UMBILICAL REPAIRS WITH MESH;  Surgeon: Sheldon Standing, MD;  Location: [facility];  Service: General;  Laterality: Bilateral;   IR RADIOLOGIST EVAL & MGMT  06/11/2023   IR  RADIOLOGIST EVAL & MGMT  08/18/2023   PROSTATE BIOPSY     RADIOLOGY WITH ANESTHESIA Right 07/21/2023   Procedure: CT WITH ANESTHESIA CRYOABLATION OF RIGHT RENAL MASS;  Surgeon: Hughes Simmonds, MD;  Location: [facility];  Service: Radiology;  Laterality: Right;   TONSILLECTOMY     as a child   UMBILICAL HERNIA REPAIR      Allergies: Patient has no known allergies.  Medications: Prior to Admission medications   Medication Sig Start Date End Date Taking? Authorizing Provider  Ascorbic Acid (VITAMIN C PO) Take 1 tablet by mouth daily.    [provider]  atorvastatin (LIPITOR) 20 MG tablet Take 20 mg by mouth daily.    [provider]  Coenzyme Q10 (CO Q-10) 100 MG CAPS Take 100 mg by mouth daily.    [provider]  CONTOUR NEXT TEST test strip CHECK BLOOD SUGARS ONCE IN THE MORNING IN VITRO **E11.22** 05/13/19   [provider]  diazepam  (VALIUM ) 5 MG tablet Take 1 tablet (5 mg total) by mouth every 6 (six) hours as needed for muscle spasms (difficulty urinating). Patient not taking: Reported on 07/01/2023 10/30/20   Sheldon Standing, MD  lisinopril  (ZESTRIL ) 20 MG tablet Take 20 mg by mouth daily. 04/10/19   [provider]  metFORMIN (GLUCOPHAGE) 500 MG tablet Take 500 mg by mouth daily with breakfast.  05/21/19   [provider]  Multiple Vitamin (  MULTIVITAMIN WITH MINERALS) TABS tablet Take 1 tablet by mouth daily.    [provider]  oxyCODONE  (ROXICODONE ) 5 MG immediate release tablet Take 1 tablet (5 mg total) by mouth every 4 (four) hours as needed for severe pain (pain score 7-10). 07/21/23   Almeda Sari Slocumb, PA-C  saw palmetto 500 MG capsule Take 500 mg by mouth daily.    [provider]  tamsulosin  (FLOMAX ) 0.4 MG CAPS capsule Take 0.4 mg by mouth daily. 05/15/19   [provider]  traMADol  (ULTRAM ) 50 MG tablet Take 1-2 tablets (50-100 mg total) by mouth every 6 (six) hours as needed for moderate pain or severe  pain. Patient not taking: Reported on 07/01/2023 10/30/20   Sheldon Standing, MD     Family History  Problem Relation Age of Onset   Cerebral aneurysm Mother    Prostate cancer Father    Bladder Cancer Father    Heart attack Father    Breast cancer Neg Hx    Colon cancer Neg Hx    Pancreatic cancer Neg Hx     Social History   Socioeconomic History   Marital status: Married    Spouse name: Terry [name]   Number of children: 2   Years of education: Not on file   Highest education level: Not on file  Occupational History    Comment: retired  Tobacco Use   Smoking status: Never   Smokeless tobacco: Never  Vaping Use   Vaping status: Never Used  Substance and Sexual Activity   Alcohol use: Never   Drug use: Never   Sexual activity: Yes  Other Topics Concern   Not on file  Social History Narrative   2 stepchildren and 2 daughters.   Social Drivers of Corporate investment banker Strain: Not on file  Food Insecurity: No Food Insecurity (07/21/2023)   Hunger Vital Sign    Worried About Running Out of Food in the Last Year: Never true    Ran Out of Food in the Last Year: Never true  Transportation Needs: No Transportation Needs (07/21/2023)   PRAPARE - Administrator, Civil Service (Medical): No    Lack of Transportation (Non-Medical): No  Physical Activity: Not on file  Stress: Not on file  Social Connections: Moderately Integrated (07/22/2023)   Social Connection and Isolation Panel    Frequency of Communication with Friends and Family: Three times a week    Frequency of Social Gatherings with Friends and Family: Twice a week    Attends Religious Services: More than 4 times per year    Active Member of [employer] or Organizations: No    Attends Engineer, structural: Never    Marital Status: Married     Vital Signs: BP (!) 160/77 (BP Location: Left Arm, Patient Position: Sitting, Cuff Size: Normal)   Pulse 75   Temp 97.6 F (36.4 C)   Resp 16   SpO2  92%   Physical Exam  General: WN, NAD  CV: RRR on monitor Pulm: normal work of breathing on RA Abd: S, ND, NT MSK: Grossly normal Psych: Appropriate affect.  Imaging:    CT AP W/WO, 11/23/23 IMPRESSION:  1. Hypodense nodular area measuring 2.3 x 1.8 cm with a central area of intrinsic hyperintensity and a peripheral rim of macroscopic fat which measures 4.5 x 2.8 cm corresponding in location with the previously cryo ablated neoplasm. Findings are favored to reflect posttreatment change. Suggest attention on follow-up imaging.  2. No evidence of metastatic disease in the abdomen or pelvis.  No results found.  Labs:  CBC: Recent Labs    07/06/23 0935 07/21/23 0655  WBC 6.2 4.6  HGB 13.7 12.4*  HCT 42.7 39.6  PLT 203 198    COAGS: Recent Labs    07/06/23 0935 07/21/23 0655  INR 1.0 1.1  APTT  --  28    BMP: Recent Labs    07/06/23 0935 07/21/23 0655  NA 138 140  K 4.4 3.9  CL 106 109  CO2 22 23  GLUCOSE 150* 143*  BUN 23 28*  CALCIUM 10.0 9.6  CREATININE 1.46* 1.37*  GFRNONAA 47* 51*    Pathology:   SURGICAL PATHOLOGY CASE: [id number] PATIENT: LYNWOOD GREENER Surgical Pathology Report  Clinical History: R cystic renal mass (las)  FINAL MICROSCOPIC DIAGNOSIS:  A. 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    Pt now s/p R renal mass biopsy and cryoablation on 07/21/23 Pathology negative, favor sampling error.  *6 mos follow up imaging w multiphasic CT post ablation (07/21/23), no sooner. *Will see him back, in virtual clinic, for imaging review in 6 mos. *Pt will also follow up with his Urologist. *Comorbidity follow up  with PCP   Thank you for allowing us  to participate in the care of this Patient.  Electronically Signed:  Thom Hall, MD [facility] Radiology   Pager. [phone] Clinic. [phone]  I spent a total of 30 Minutes of face-to-face time in clinical consultation, greater than 50% of which was counseling/coordinating care for Mr. Percival Glasheen evaluation for renal mass treatment.

## 2023-12-30 DIAGNOSIS — K8020 Calculus of gallbladder without cholecystitis without obstruction: Secondary | ICD-10-CM | POA: Diagnosis not present

## 2023-12-30 DIAGNOSIS — R609 Edema, unspecified: Secondary | ICD-10-CM | POA: Diagnosis not present

## 2023-12-30 DIAGNOSIS — N1832 Chronic kidney disease, stage 3b: Secondary | ICD-10-CM | POA: Diagnosis not present

## 2023-12-30 DIAGNOSIS — Z5181 Encounter for therapeutic drug level monitoring: Secondary | ICD-10-CM | POA: Diagnosis not present

## 2023-12-30 DIAGNOSIS — N281 Cyst of kidney, acquired: Secondary | ICD-10-CM | POA: Diagnosis not present

## 2023-12-30 DIAGNOSIS — I700 Atherosclerosis of aorta: Secondary | ICD-10-CM | POA: Diagnosis not present

## 2023-12-30 DIAGNOSIS — Z8546 Personal history of malignant neoplasm of prostate: Secondary | ICD-10-CM | POA: Diagnosis not present

## 2023-12-30 DIAGNOSIS — H9193 Unspecified hearing loss, bilateral: Secondary | ICD-10-CM | POA: Diagnosis not present

## 2023-12-30 DIAGNOSIS — I129 Hypertensive chronic kidney disease with stage 1 through stage 4 chronic kidney disease, or unspecified chronic kidney disease: Secondary | ICD-10-CM | POA: Diagnosis not present

## 2023-12-30 DIAGNOSIS — E7800 Pure hypercholesterolemia, unspecified: Secondary | ICD-10-CM | POA: Diagnosis not present

## 2023-12-30 DIAGNOSIS — E1122 Type 2 diabetes mellitus with diabetic chronic kidney disease: Secondary | ICD-10-CM | POA: Diagnosis not present

## 2023-12-30 DIAGNOSIS — G629 Polyneuropathy, unspecified: Secondary | ICD-10-CM | POA: Diagnosis not present

## 2024-01-03 NOTE — Addendum Note (Signed)
 Encounter addended by: Hughes Simmonds, MD on: 01/03/2024 8:40 AM  Actions taken: Clinical Note Signed

## 2024-02-01 DIAGNOSIS — L720 Epidermal cyst: Secondary | ICD-10-CM | POA: Diagnosis not present

## 2024-02-01 DIAGNOSIS — D2272 Melanocytic nevi of left lower limb, including hip: Secondary | ICD-10-CM | POA: Diagnosis not present

## 2024-02-01 DIAGNOSIS — D225 Melanocytic nevi of trunk: Secondary | ICD-10-CM | POA: Diagnosis not present

## 2024-02-01 DIAGNOSIS — L578 Other skin changes due to chronic exposure to nonionizing radiation: Secondary | ICD-10-CM | POA: Diagnosis not present

## 2024-02-01 DIAGNOSIS — L814 Other melanin hyperpigmentation: Secondary | ICD-10-CM | POA: Diagnosis not present

## 2024-02-01 DIAGNOSIS — L570 Actinic keratosis: Secondary | ICD-10-CM | POA: Diagnosis not present

## 2024-02-01 DIAGNOSIS — L821 Other seborrheic keratosis: Secondary | ICD-10-CM | POA: Diagnosis not present

## 2024-02-02 DIAGNOSIS — R748 Abnormal levels of other serum enzymes: Secondary | ICD-10-CM | POA: Diagnosis not present

## 2024-02-17 DIAGNOSIS — D49511 Neoplasm of unspecified behavior of right kidney: Secondary | ICD-10-CM | POA: Diagnosis not present

## 2024-02-17 DIAGNOSIS — C61 Malignant neoplasm of prostate: Secondary | ICD-10-CM | POA: Diagnosis not present

## 2024-02-22 DIAGNOSIS — K8020 Calculus of gallbladder without cholecystitis without obstruction: Secondary | ICD-10-CM | POA: Diagnosis not present

## 2024-02-22 DIAGNOSIS — K5730 Diverticulosis of large intestine without perforation or abscess without bleeding: Secondary | ICD-10-CM | POA: Diagnosis not present

## 2024-02-22 DIAGNOSIS — D49511 Neoplasm of unspecified behavior of right kidney: Secondary | ICD-10-CM | POA: Diagnosis not present

## 2024-02-22 DIAGNOSIS — N200 Calculus of kidney: Secondary | ICD-10-CM | POA: Diagnosis not present

## 2024-03-08 DIAGNOSIS — N401 Enlarged prostate with lower urinary tract symptoms: Secondary | ICD-10-CM | POA: Diagnosis not present

## 2024-03-08 DIAGNOSIS — D49511 Neoplasm of unspecified behavior of right kidney: Secondary | ICD-10-CM | POA: Diagnosis not present

## 2024-03-08 DIAGNOSIS — Z8546 Personal history of malignant neoplasm of prostate: Secondary | ICD-10-CM | POA: Diagnosis not present

## 2024-03-08 DIAGNOSIS — R351 Nocturia: Secondary | ICD-10-CM | POA: Diagnosis not present

## 2024-03-31 DIAGNOSIS — Z23 Encounter for immunization: Secondary | ICD-10-CM | POA: Diagnosis not present
